# Patient Record
Sex: Male | Born: 1945
Health system: Southern US, Community
[De-identification: ages and names within clinical notes are randomized; demographics above are authoritative.]

## PROBLEM LIST (undated history)

## (undated) DIAGNOSIS — K7460 Unspecified cirrhosis of liver: Secondary | ICD-10-CM

## (undated) DIAGNOSIS — G5600 Carpal tunnel syndrome, unspecified upper limb: Secondary | ICD-10-CM

## (undated) DIAGNOSIS — K649 Unspecified hemorrhoids: Secondary | ICD-10-CM

## (undated) DIAGNOSIS — E785 Hyperlipidemia, unspecified: Secondary | ICD-10-CM

## (undated) DIAGNOSIS — I712 Thoracic aortic aneurysm, without rupture, unspecified: Secondary | ICD-10-CM

## (undated) HISTORY — PX: TONSILLECTOMY: SUR1361

## (undated) HISTORY — DX: Unspecified hemorrhoids: K64.9

## (undated) HISTORY — DX: Thoracic aortic aneurysm, without rupture, unspecified: I71.20

## (undated) HISTORY — DX: Unspecified cirrhosis of liver: K74.60

## (undated) HISTORY — DX: Carpal tunnel syndrome, unspecified upper limb: G56.00

## (undated) HISTORY — DX: Hyperlipidemia, unspecified: E78.5

## (undated) HISTORY — PX: EYE SURGERY: SHX253

---
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
## 2009-11-30 HISTORY — PX: COLONOSCOPY: SHX174

## 2009-12-13 LAB — HM COLONOSCOPY: HM Colonoscopy: NORMAL

## 2013-12-13 ENCOUNTER — Ambulatory Visit (INDEPENDENT_AMBULATORY_CARE_PROVIDER_SITE_OTHER): Payer: 59 | Admitting: Internal Medicine

## 2013-12-13 ENCOUNTER — Encounter: Payer: Self-pay | Admitting: Internal Medicine

## 2013-12-13 VITALS — BP 138/88 | HR 63 | Temp 98.0°F | Resp 16 | Ht 70.1 in | Wt 178.8 lb

## 2013-12-13 DIAGNOSIS — Z23 Encounter for immunization: Secondary | ICD-10-CM

## 2013-12-13 DIAGNOSIS — R5383 Other fatigue: Principal | ICD-10-CM

## 2013-12-13 DIAGNOSIS — R030 Elevated blood-pressure reading, without diagnosis of hypertension: Secondary | ICD-10-CM

## 2013-12-13 DIAGNOSIS — R5381 Other malaise: Secondary | ICD-10-CM

## 2013-12-13 DIAGNOSIS — E785 Hyperlipidemia, unspecified: Secondary | ICD-10-CM

## 2013-12-13 DIAGNOSIS — IMO0001 Reserved for inherently not codable concepts without codable children: Secondary | ICD-10-CM

## 2013-12-13 MED ORDER — ATORVASTATIN CALCIUM 20 MG PO TABS: 20.0000 mg | ORAL_TABLET | Freq: Every day | ORAL | Status: DC

## 2013-12-13 MED ORDER — TETANUS-DIPHTH-ACELL PERTUSSIS 5-2.5-18.5 LF-MCG/0.5 IM SUSP: 0.5000 mL | Freq: Once | INTRAMUSCULAR | Status: DC

## 2013-12-13 MED ORDER — ZOSTER VACCINE LIVE 19400 UNT/0.65ML ~~LOC~~ SOLR: 0.6500 mL | Freq: Once | SUBCUTANEOUS | Status: DC

## 2013-12-13 NOTE — Progress Notes (Signed)
Patient ID: Richard Clayton, male   DOB: Mar 15, 1946, 68 y.o.   MRN: [id number]     Patient Active Problem List   Diagnosis Date Noted  . Elevated blood pressure 12/16/2013  . Hyperlipidemia LDL goal < 160 12/16/2013    Subjective:  CC:   Chief Complaint  Patient presents with  . Establish Care    HPI:   Richard Clayton is a 68 y.o. male who presents as a new patient to establish primary care with the chief complaint of  Have bloodwork. 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  Relocated from [place] 3 months ago. No chief complaint.  Here to establish care. Takes a statin and needs labs done today  Works out regularly, using the treadmill and weights 3 times weekly.  No muscle pain or joint pain .  No dyspne or chest pain with exercise.  Healthy Mediterranean diet, Planning on taking up swimming. Has occasional knee pain which improved when he stopped jogging.  NO history of herniated disk, but has had back pain in the past and attributes resolution to maintaining regular exercise.  Confirmed bachelor, Has a cat for companion .  Indoor.,   Last Colonoscopy 2011.     Past Medical History  Diagnosis Date  . Hyperlipidemia     Past Surgical History  Procedure Laterality Date  . Tonsilectomy/adenoidectomy with myringotomy  1950    Family History  Problem Relation Age of Onset  . Hyperlipidemia Mother   . Cancer Father 27    from working in [employer]  . Cancer Sister 76    in remission    History   Social History  . Marital Status: Single    Spouse Name: N/A    Number of Children: N/A  . Years of Education: N/A   Occupational History  . Not on file.   Social History Main Topics  . Smoking status: Former Smoker -- 1.00 packs/day    Types: Cigarettes    Quit date: 12/13/1968  . Smokeless tobacco: Never Used  . Alcohol Use: Yes     Comment: wine in the evenings   . Drug Use: No  . Sexual Activity: Yes   Other Topics Concern  . Not on file   Social History Narrative  . No narrative on file   No Known Allergies   Review of Systems:  Patient denies headache, fevers, malaise, unintentional weight loss, skin rash, eye pain, sinus congestion and sinus pain, sore throat, dysphagia,  hemoptysis , cough, dyspnea, wheezing, chest pain, palpitations, orthopnea, edema, abdominal pain, nausea, melena, diarrhea, constipation, flank pain, dysuria, hematuria, urinary  Frequency, nocturia, numbness, tingling, seizures,  Focal weakness, Loss of consciousness,  Tremor, insomnia, depression, anxiety, and suicidal ideation.          Objective:  BP 138/88  Pulse 63  Temp(Src) 98 F (36.7 C) (Oral)  Resp 16  Ht 5' 10.1" (1.781 m)  Wt 178 lb 12 oz (81.08 kg)  BMI 25.56 kg/m2  SpO2 97%  General appearance: alert, cooperative and appears stated age Ears: normal TM's and external ear canals both ears Throat: lips, mucosa, and tongue normal; teeth and gums normal Neck: no adenopathy, no carotid bruit, supple, symmetrical, trachea midline and thyroid not enlarged, symmetric, no tenderness/mass/nodules Back: symmetric, no curvature. ROM normal. No CVA tenderness. Lungs: clear to auscultation bilaterally Heart: regular rate and rhythm, S1, S2 normal, no murmur, click,  rub or gallop Abdomen: soft, non-tender; bowel sounds normal; no masses,  no organomegaly Pulses: 2+ and symmetric Skin: Skin color, texture, turgor normal. No rashes or lesions Lymph nodes: Cervical, supraclavicular, and axillary nodes normal.  Assessment and Plan:  Elevated blood pressure Diastolic elevation discussed.  No prior history of hypertension.  He has normal renal function and no proteinuria.  Will follow for now Return in 6 months.   Lab Results  Component Value Date   CREATININE 1.0 12/13/2013    Lab Results  Component Value Date   MICROALBUR 0.2 12/13/2013     Hyperlipidemia LDL goal < 160 Managed with statin . Well controlled on current statin therapy.   Liver enzymes are normal ,except for mild elevation of bilirubin.  no changes today.return in 6 months for repeat surveillance.   Lab Results  Component Value Date   CHOL 158 12/13/2013   HDL 71.10 12/13/2013   LDLCALC 77 12/13/2013   TRIG 52.0 12/13/2013   CHOLHDL 2 12/13/2013   Lab Results  Component Value Date   ALT 30 12/13/2013   AST 34 12/13/2013   ALKPHOS 79 12/13/2013   BILITOT 1.4* 12/13/2013     A total of 45 minutes was spent with patient more than half of which was spent in counseling, reviewing records from other prior providers and coordination of care.  Updated Medication List Outpatient Encounter Prescriptions as of 12/13/2013  Medication Sig  . Ascorbic Acid (VITAMIN C) 1000 MG tablet Take 1,000 mg by mouth daily.  [name]. aspirin 81 MG tablet Take 81 mg by mouth daily.  [name]. atorvastatin (LIPITOR) 20 MG tablet Take 1 tablet (20 mg total) by mouth daily.  . Lecithin 1200 MG CAPS Take 1 capsule by mouth 3 (three) times daily.  . Misc Natural Products (OSTEO BI-FLEX ADV JOINT SHIELD PO) Take 1 tablet by mouth daily.  . Multiple Vitamins tablet Take 1 tablet by mouth daily.  . Tdap (BOOSTRIX) 5-2.5-18.5 LF-MCG/0.5 injection Inject 0.5 mLs into the muscle once.  . vitamin E (VITAMIN E) 1000 UNIT capsule Take 1,000 Units by mouth daily.  [name]. zoster vaccine live, PF, (ZOSTAVAX) [id number] UNT/0.65ML injection Inject 19,400 Units into the skin once.  . [DISCONTINUED] atorvastatin (LIPITOR) 20 MG tablet Take 20 mg by mouth daily.     Orders Placed This Encounter  Procedures  . Pneumococcal conjugate vaccine 13-valent  . CBC with Differential  . Comprehensive metabolic panel  . TSH  . Lipid panel  . Microalbumin / creatinine urine ratio  . HM COLONOSCOPY    No Follow-up on file.

## 2013-12-13 NOTE — Patient Instructions (Addendum)
Welcome to the [facility]!!  I enjoyed meeting you today.  Please thank Roe Coombs for referring you to me for your healthcare.   You received the Prevnar (pneumonia) vaccine.  We will call you with the results of your labs today.  Plan to return in 6 months for your annual wellness exam   I recommend getting the  tetanus-diptheria-pertussis vaccine (TDaP) and the Zostavax (Shingles vaccine) but you may spend less $$$ at a local pharmacy with the scripts I have provided you.      Since you are [place], you may want to try :     [facility]  [address] (?) in [place]  For your deli needs(fresh mozzarella, sausage,  Etc.)   [facility] in [place], [place] (30 minutes, towards [place])  for excellent [place] fare/dining.   Debarah Crape the owner , is from [facility] in [place] is also a very nice and Musician in the [employer]

## 2013-12-13 NOTE — Progress Notes (Signed)
Pre-visit discussion using our clinic review tool. No additional management support is needed unless otherwise documented below in the visit note.  

## 2013-12-14 LAB — COMPREHENSIVE METABOLIC PANEL
ALT: 30 U/L (ref 0–53)
AST: 34 U/L (ref 0–37)
Albumin: 4.2 g/dL (ref 3.5–5.2)
Alkaline Phosphatase: 79 U/L (ref 39–117)
BILIRUBIN TOTAL: 1.4 mg/dL — AB (ref 0.3–1.2)
BUN: 14 mg/dL (ref 6–23)
CO2: 28 meq/L (ref 19–32)
Calcium: 9.6 mg/dL (ref 8.4–10.5)
Chloride: 103 mEq/L (ref 96–112)
Creatinine, Ser: 1 mg/dL (ref 0.4–1.5)
GFR: 77.96 mL/min (ref >60.00)
GLUCOSE: 81 mg/dL (ref 70–99)
POTASSIUM: 4.1 meq/L (ref 3.5–5.1)
Sodium: 138 mEq/L (ref 135–145)
TOTAL PROTEIN: 7.4 g/dL (ref 6.0–8.3)

## 2013-12-14 LAB — TSH: TSH: 1.27 u[IU]/mL (ref 0.35–5.50)

## 2013-12-14 LAB — LIPID PANEL
CHOLESTEROL: 158 mg/dL (ref 0–200)
HDL: 71.1 mg/dL (ref >39.00)
LDL CALC: 77 mg/dL (ref 0–99)
TRIGLYCERIDES: 52 mg/dL (ref 0.0–149.0)
Total CHOL/HDL Ratio: 2
VLDL: 10.4 mg/dL (ref 0.0–40.0)

## 2013-12-14 LAB — CBC WITH DIFFERENTIAL/PLATELET
Basophils Absolute: 0 10*3/uL (ref 0.0–0.1)
Basophils Relative: 0.5 % (ref 0.0–3.0)
EOS ABS: 0.2 10*3/uL (ref 0.0–0.7)
EOS PCT: 2.6 % (ref 0.0–5.0)
HCT: 43.2 % (ref 39.0–52.0)
HEMOGLOBIN: 14.7 g/dL (ref 13.0–17.0)
LYMPHS PCT: 17.4 % (ref 12.0–46.0)
Lymphs Abs: 1.2 10*3/uL (ref 0.7–4.0)
MCHC: 34 g/dL (ref 30.0–36.0)
MCV: 89.2 fl (ref 78.0–100.0)
Monocytes Absolute: 0.6 10*3/uL (ref 0.1–1.0)
Monocytes Relative: 9.1 % (ref 3.0–12.0)
NEUTROS ABS: 5 10*3/uL (ref 1.4–7.7)
Neutrophils Relative %: 70.4 % (ref 43.0–77.0)
Platelets: 213 10*3/uL (ref 150.0–400.0)
RBC: 4.84 Mil/uL (ref 4.22–5.81)
RDW: 12.6 % (ref 11.5–14.6)
WBC: 7.1 10*3/uL (ref 4.5–10.5)

## 2013-12-14 LAB — MICROALBUMIN / CREATININE URINE RATIO
CREATININE, U: 35.6 mg/dL
MICROALB/CREAT RATIO: 0.6 mg/g (ref 0.0–30.0)
Microalb, Ur: 0.2 mg/dL (ref 0.0–1.9)

## 2013-12-16 ENCOUNTER — Encounter: Payer: Self-pay | Admitting: Internal Medicine

## 2013-12-16 DIAGNOSIS — IMO0001 Reserved for inherently not codable concepts without codable children: Secondary | ICD-10-CM | POA: Insufficient documentation

## 2013-12-16 DIAGNOSIS — E785 Hyperlipidemia, unspecified: Secondary | ICD-10-CM | POA: Insufficient documentation

## 2013-12-16 DIAGNOSIS — R030 Elevated blood-pressure reading, without diagnosis of hypertension: Secondary | ICD-10-CM

## 2013-12-16 NOTE — Assessment & Plan Note (Signed)
Managed with statin . Well controlled on current statin therapy.   Liver enzymes are normal ,except for mild elevation of bilirubin.  no changes today.return in 6 months for repeat surveillance.   Lab Results  Component Value Date   CHOL 158 12/13/2013   HDL 71.10 12/13/2013   LDLCALC 77 12/13/2013   TRIG 52.0 12/13/2013   CHOLHDL 2 12/13/2013   Lab Results  Component Value Date   ALT 30 12/13/2013   AST 34 12/13/2013   ALKPHOS 79 12/13/2013   BILITOT 1.4* 12/13/2013

## 2013-12-16 NOTE — Assessment & Plan Note (Addendum)
Diastolic elevation discussed.  No prior history of hypertension.  He has normal renal function and no proteinuria.  Will follow for now Return in 6 months.   Lab Results  Component Value Date   CREATININE 1.0 12/13/2013    Lab Results  Component Value Date   MICROALBUR 0.2 12/13/2013

## 2013-12-19 ENCOUNTER — Encounter: Payer: Self-pay | Admitting: *Deleted

## 2014-03-12 ENCOUNTER — Other Ambulatory Visit: Payer: Self-pay | Admitting: Internal Medicine

## 2014-06-08 ENCOUNTER — Other Ambulatory Visit: Payer: Self-pay | Admitting: Internal Medicine

## 2014-07-13 ENCOUNTER — Ambulatory Visit (INDEPENDENT_AMBULATORY_CARE_PROVIDER_SITE_OTHER): Payer: 59 | Admitting: Internal Medicine

## 2014-07-13 ENCOUNTER — Encounter: Payer: Self-pay | Admitting: Internal Medicine

## 2014-07-13 VITALS — BP 124/60 | HR 79 | Temp 98.3°F | Resp 16 | Ht 70.0 in | Wt 170.8 lb

## 2014-07-13 DIAGNOSIS — R030 Elevated blood-pressure reading, without diagnosis of hypertension: Secondary | ICD-10-CM

## 2014-07-13 DIAGNOSIS — R634 Abnormal weight loss: Secondary | ICD-10-CM

## 2014-07-13 DIAGNOSIS — E785 Hyperlipidemia, unspecified: Secondary | ICD-10-CM

## 2014-07-13 DIAGNOSIS — Z0000 Encounter for general adult medical examination without abnormal findings: Secondary | ICD-10-CM

## 2014-07-13 DIAGNOSIS — Z1159 Encounter for screening for other viral diseases: Secondary | ICD-10-CM

## 2014-07-13 DIAGNOSIS — Z125 Encounter for screening for malignant neoplasm of prostate: Secondary | ICD-10-CM

## 2014-07-13 DIAGNOSIS — IMO0001 Reserved for inherently not codable concepts without codable children: Secondary | ICD-10-CM

## 2014-07-13 DIAGNOSIS — R5381 Other malaise: Secondary | ICD-10-CM

## 2014-07-13 DIAGNOSIS — R5383 Other fatigue: Secondary | ICD-10-CM

## 2014-07-13 DIAGNOSIS — Z79899 Other long term (current) drug therapy: Secondary | ICD-10-CM

## 2014-07-13 LAB — COMPREHENSIVE METABOLIC PANEL
ALBUMIN: 4.1 g/dL (ref 3.5–5.2)
ALT: 29 U/L (ref 0–53)
AST: 30 U/L (ref 0–37)
Alkaline Phosphatase: 82 U/L (ref 39–117)
BUN: 11 mg/dL (ref 6–23)
CO2: 27 meq/L (ref 19–32)
Calcium: 10 mg/dL (ref 8.4–10.5)
Chloride: 99 mEq/L (ref 96–112)
Creatinine, Ser: 0.9 mg/dL (ref 0.4–1.5)
GFR: 86.68 mL/min (ref >60.00)
Glucose, Bld: 90 mg/dL (ref 70–99)
POTASSIUM: 4.4 meq/L (ref 3.5–5.1)
SODIUM: 135 meq/L (ref 135–145)
TOTAL PROTEIN: 6.8 g/dL (ref 6.0–8.3)
Total Bilirubin: 1.4 mg/dL — ABNORMAL HIGH (ref 0.2–1.2)

## 2014-07-13 LAB — CBC WITH DIFFERENTIAL/PLATELET
BASOS ABS: 0 10*3/uL (ref 0.0–0.1)
Basophils Relative: 0.5 % (ref 0.0–3.0)
Eosinophils Absolute: 0.1 10*3/uL (ref 0.0–0.7)
Eosinophils Relative: 1.8 % (ref 0.0–5.0)
HCT: 43.8 % (ref 39.0–52.0)
HEMOGLOBIN: 14.9 g/dL (ref 13.0–17.0)
Lymphocytes Relative: 20.4 % (ref 12.0–46.0)
Lymphs Abs: 1 10*3/uL (ref 0.7–4.0)
MCHC: 34 g/dL (ref 30.0–36.0)
MCV: 90.5 fl (ref 78.0–100.0)
MONO ABS: 0.5 10*3/uL (ref 0.1–1.0)
MONOS PCT: 10.1 % (ref 3.0–12.0)
NEUTROS ABS: 3.3 10*3/uL (ref 1.4–7.7)
Neutrophils Relative %: 67.2 % (ref 43.0–77.0)
PLATELETS: 164 10*3/uL (ref 150.0–400.0)
RBC: 4.84 Mil/uL (ref 4.22–5.81)
RDW: 12.9 % (ref 11.5–15.5)
WBC: 4.9 10*3/uL (ref 4.0–10.5)

## 2014-07-13 LAB — LIPID PANEL
CHOL/HDL RATIO: 2
Cholesterol: 156 mg/dL (ref 0–200)
HDL: 68.3 mg/dL (ref >39.00)
LDL Cholesterol: 78 mg/dL (ref 0–99)
NonHDL: 87.7
Triglycerides: 48 mg/dL (ref 0.0–149.0)
VLDL: 9.6 mg/dL (ref 0.0–40.0)

## 2014-07-13 LAB — TSH: TSH: 1.4 u[IU]/mL (ref 0.35–4.50)

## 2014-07-13 LAB — PSA, MEDICARE: PSA: 1.59 ng/ml (ref 0.10–4.00)

## 2014-07-13 MED ORDER — ZOSTER VACCINE LIVE 19400 UNT/0.65ML ~~LOC~~ SOLR: 0.6500 mL | Freq: Once | SUBCUTANEOUS | Status: DC

## 2014-07-13 MED ORDER — TETANUS-DIPHTH-ACELL PERTUSSIS 5-2.5-18.5 LF-MCG/0.5 IM SUSP: 0.5000 mL | Freq: Once | INTRAMUSCULAR | Status: DC

## 2014-07-13 NOTE — Patient Instructions (Signed)
You had your annual Medicare wellness exam today  Please use the stool kit to send [place] back a sample to test for blood.  This is your colon CA screening test.   You need to have a TDaP vaccine and a Shingles vaccine.  I have given you prescriptions for thses because they will be cheaper at the health Dept or at your  local pharmacy because Medicare will not reimburse for them.   We will contact you with the bloodwork results  Health Maintenance A healthy lifestyle and preventative care can promote health and wellness.  Maintain regular health, dental, and eye exams.  Eat a healthy diet. Foods like vegetables, fruits, whole grains, low-fat dairy products, and lean protein foods contain the nutrients you need and are low in calories. Decrease your intake of foods high in solid fats, added sugars, and salt. Get information about a proper diet from your health care provider, if necessary.  Regular physical exercise is one of the most important things you can do for your health. Most adults should get at least 150 minutes of moderate-intensity exercise (any activity that increases your heart rate and causes you to sweat) each week. In addition, most adults need muscle-strengthening exercises on 2 or more days a week.   Maintain a healthy weight. The body mass index (BMI) is a screening tool to identify possible weight problems. It provides an estimate of body fat based on height and weight. Your health care provider can find your BMI and can help you achieve or maintain a healthy weight. For males 20 years and older:  A BMI below 18.5 is considered underweight.  A BMI of 18.5 to 24.9 is normal.  A BMI of 25 to 29.9 is considered overweight.  A BMI of 30 and above is considered obese.  Maintain normal blood lipids and cholesterol by exercising and minimizing your intake of saturated fat. Eat a balanced diet with plenty of fruits and vegetables. Blood tests for lipids and cholesterol should  begin at age 38 and be repeated every 5 years. If your lipid or cholesterol levels are high, you are over age 31, or you are at high risk for heart disease, you may need your cholesterol levels checked more frequently.Ongoing high lipid and cholesterol levels should be treated with medicines if diet and exercise are not working.  If you smoke, find out from your health care provider how to quit. If you do not use tobacco, do not start.  Lung cancer screening is recommended for adults aged 33-80 years who are at high risk for developing lung cancer because of a history of smoking. A yearly low-dose CT scan of the lungs is recommended for people who have at least a 30-pack-year history of smoking and are current smokers or have quit within the past 15 years. A pack year of smoking is smoking an average of 1 pack of cigarettes a day for 1 year (for example, a 30-pack-year history of smoking could mean smoking 1 pack a day for 30 years or 2 packs a day for 15 years). Yearly screening should continue until the smoker has stopped smoking for at least 15 years. Yearly screening should be stopped for people who develop a health problem that would prevent them from having lung cancer treatment.  If you choose to drink alcohol, do not have more than 2 drinks per day. One drink is considered to be 12 oz (360 mL) of beer, 5 oz (150 mL) of wine, or 1.5  oz (45 mL) of liquor.  Avoid the use of street drugs. Do not share needles with anyone. Ask for help if you need support or instructions about stopping the use of drugs.  High blood pressure causes heart disease and increases the risk of stroke. Blood pressure should be checked at least every 1-2 years. Ongoing high blood pressure should be treated with medicines if weight loss and exercise are not effective.  If you are 25-52 years old, ask your health care provider if you should take aspirin to prevent heart disease.  Diabetes screening involves taking a blood  sample to check your fasting blood sugar level. This should be done once every 3 years after age 90 if you are at a normal weight and without risk factors for diabetes. Testing should be considered at a younger age or be carried out more frequently if you are overweight and have at least 1 risk factor for diabetes.  Colorectal cancer can be detected and often prevented. Most routine colorectal cancer screening begins at the age of 35 and continues through age 3. However, your health care provider may recommend screening at an earlier age if you have risk factors for colon cancer. On a yearly basis, your health care provider may provide home test kits to check for hidden blood in the stool. A small camera at the end of a tube may be used to directly examine the colon (sigmoidoscopy or colonoscopy) to detect the earliest forms of colorectal cancer. Talk to your health care provider about this at age 63 when routine screening begins. A direct exam of the colon should be repeated every 5-10 years through age 48, unless early forms of precancerous polyps or small growths are found.  People who are at an increased risk for hepatitis B should be screened for this virus. You are considered at high risk for hepatitis B if:  You were born in a country where hepatitis B occurs often. Talk with your health care provider about which countries are considered high risk.  Your parents were born in a high-risk country and you have not received a shot to protect against hepatitis B (hepatitis B vaccine).  You have HIV or AIDS.  You use needles to inject street drugs.  You live with, or have sex with, someone who has hepatitis B.  You are a man who has sex with other men (MSM).  You get hemodialysis treatment.  You take certain medicines for conditions like cancer, organ transplantation, and autoimmune conditions.  Hepatitis C blood testing is recommended for all people born from [birth year] through 1966 and any  individual with known risk factors for hepatitis C.  Healthy men should no longer receive prostate-specific antigen (PSA) blood tests as part of routine cancer screening. Talk to your health care provider about prostate cancer screening.  Testicular cancer screening is not recommended for adolescents or adult males who have no symptoms. Screening includes self-exam, a health care provider exam, and other screening tests. Consult with your health care provider about any symptoms you have or any concerns you have about testicular cancer.  Practice safe sex. Use condoms and avoid high-risk sexual practices to reduce the spread of sexually transmitted infections (STIs).  You should be screened for STIs, including gonorrhea and chlamydia if:  You are sexually active and are younger than 24 years.  You are older than 24 years, and your health care provider tells you that you are at risk for this type of infection.  Your  sexual activity has changed since you were last screened, and you are at an increased risk for chlamydia or gonorrhea. Ask your health care provider if you are at risk.  If you are at risk of being infected with HIV, it is recommended that you take a prescription medicine daily to prevent HIV infection. This is called pre-exposure prophylaxis (PrEP). You are considered at risk if:  You are a man who has sex with other men (MSM).  You are a heterosexual man who is sexually active with multiple partners.  You take drugs by injection.  You are sexually active with a partner who has HIV.  Talk with your health care provider about whether you are at high risk of being infected with HIV. If you choose to begin PrEP, you should first be tested for HIV. You should then be tested every 3 months for as long as you are taking PrEP.  Use sunscreen. Apply sunscreen liberally and repeatedly throughout the day. You should seek shade when your shadow is shorter than you. Protect yourself by  wearing long sleeves, pants, a wide-brimmed hat, and sunglasses year round whenever you are outdoors.  Tell your health care provider of new moles or changes in moles, especially if there is a change in shape or color. Also, tell your health care provider if a mole is larger than the size of a pencil eraser.  A one-time screening for abdominal aortic aneurysm (AAA) and surgical repair of large AAAs by ultrasound is recommended for men aged 60-75 years who are current or former smokers.  Stay current with your vaccines (immunizations). Document Released: 05/14/2008 Document Revised: 11/21/2013 Document Reviewed: 04/13/2011 [facility] Patient Information 2015 [place], [place]. This information is not intended to replace advice given to you by your health care provider. Make sure you discuss any questions you have with your health care provider.

## 2014-07-13 NOTE — Progress Notes (Signed)
Pre visit review using our clinic review tool, if applicable. No additional management support is needed unless otherwise documented below in the visit note. 

## 2014-07-13 NOTE — Progress Notes (Signed)
Patient ID: Richard ComesSalvatore Clayton, male   DOB: May 20, 1946, 68 y.o.   MRN: [id number]   The patient is here for annual Medicare wellness examination along with a comprehensive physical exam and management of other chronic and acute problems.  Has lost 8 lbs and reduced his blood pressure s by reducing the salt in his diet .  He continues to exercise regularly and denies andy s significant joint pain, but notes stiffness of knees and lower back early morning upon rising whic improves with activity     The risk factors are reflected in the social history.  The roster of all physicians providing medical care to patient - is listed in the Snapshot section of the chart.  Activities of daily living:  The patient is 100% independent in all ADLs: dressing, toileting, feeding as well as independent mobility  Home safety : The patient has smoke detectors in the home. They wear seatbelts.  There are no firearms at home. There is no violence in the home.   There is no risks for hepatitis, STDs or HIV. There is no   history of blood transfusion. They have no travel history to infectious disease endemic areas of the world.  The patient has seen their dentist in the last six month. They have seen their eye doctor in the last year. They admit to slight hearing difficulty with regard to whispered voices and some television programs.  They have deferred audiologic testing in the last year.  They do not  have excessive sun exposure. Discussed the need for sun protection: hats, long sleeves and use of sunscreen if there is significant sun exposure.   Diet: the importance of a healthy diet is discussed. They do have a healthy diet.  The benefits of regular aerobic exercise were discussed. She walks 4 times per week ,  20 minutes.   Depression screen: there are no signs or vegative symptoms of depression- irritability, change in appetite, anhedonia, sadness/tearfullness.  Cognitive assessment: the patient manages all  their financial and personal affairs and is actively engaged. They could relate day,date,year and events; recalled 2/3 objects at 3 minutes; performed clock-face test normally.  The following portions of the patient's history were reviewed and updated as appropriate: allergies, current medications, past family history, past medical history,  past surgical history, past social history  and problem list.  Visual acuity was not assessed per patient preference since she has regular follow up with her ophthalmologist. Hearing and body mass index were assessed and reviewed.   During the course of the visit the patient was educated and counseled about appropriate screening and preventive services including : fall prevention , diabetes screening, nutrition counseling, colorectal cancer screening, and recommended immunizations.    Objective:  BP 124/60  Pulse 79  Temp(Src) 98.3 F (36.8 C) (Oral)  Resp 16  Ht 5\' 10"  (1.778 m)  Wt 170 lb 12.8 oz (77.474 kg)  BMI 24.51 kg/m2  SpO2 99%  General Appearance:    Alert, cooperative, no distress, appears stated age  Head:    Normocephalic, without obvious abnormality, atraumatic  Eyes:    PERRL, conjunctiva/corneas clear, EOM's intact, fundi    benign, both eyes       Ears:    Normal TM's and external ear canals, both ears  Nose:   Nares normal, septum midline, mucosa normal, no drainage   or sinus tenderness  Throat:   Lips, mucosa, and tongue normal; teeth and gums normal  Neck:   Supple, symmetrical,  trachea midline, no adenopathy;       thyroid:  No enlargement/tenderness/nodules; no carotid   bruit or JVD  Back:     Symmetric, no curvature, ROM normal, no CVA tenderness  Lungs:     Clear to auscultation bilaterally, respirations unlabored  Chest wall:    No tenderness or deformity  Heart:    Regular rate and rhythm, S1 and S2 normal, no murmur, rub   or gallop  Abdomen:     Soft, non-tender, bowel sounds active all four quadrants,    no  masses, no organomegaly  Genitalia:    Normal circumsized male without lesion, discharge or tenderness. No inguinal hernias or testicular masses  Rectal:    Normal tone, normal prostate, no masses or tenderness;     Extremities:   Extremities normal, atraumatic, no cyanosis or edema  Pulses:   2+ and symmetric all extremities  Skin:   Skin color, texture, turgor normal, no rashes or lesions  Lymph nodes:   Cervical, supraclavicular, and axillary nodes normal  Neurologic:   CNII-XII intact. Normal strength, sensation and reflexes      throughout   Assessment and Plan:  Elevated blood pressure Resolved with reduction of salt in diet and wt loss.   Hyperlipidemia with target LDL less than 160 Managed with lipitor. HDL/LDL ratio is very favorable.  Liver enzymes are normal , no changes today.  Lab Results  Component Value Date   CHOL 156 07/13/2014   HDL 68.30 07/13/2014   LDLCALC 78 07/13/2014   TRIG 48.0 07/13/2014   CHOLHDL 2 07/13/2014    Lab Results  Component Value Date   ALT 29 07/13/2014   AST 30 07/13/2014   ALKPHOS 82 07/13/2014   BILITOT 1.4* 07/13/2014     Medicare annual wellness visit, subsequent Annual male wellness visit was done along with  Screening for STDS and depressive symptoms was negative. Vaccines were reviewed and .  Healthy habits were reviewed including use of seatbelts 100% of the time , moderate alcohol consumption, regular exercise, and the  mediterranean diet.   Physical exam, annual  a comprehensive physical exam including testicular and prostate exam and check for hernias was done.    Updated Medication List Outpatient Encounter Prescriptions as of 07/13/2014  Medication Sig  . Ascorbic Acid (VITAMIN C) 1000 MG tablet Take 1,000 mg by mouth daily.  [name] atorvastatin (LIPITOR) 20 MG tablet TAKE 1 TABLET BY MOUTH EVERY DAY  . Lecithin 1200 MG CAPS Take 1 capsule by mouth 3 (three) times daily.  . Misc Natural Products (OSTEO BI-FLEX ADV JOINT SHIELD  PO) Take 1 tablet by mouth daily.  . Multiple Vitamins tablet Take 1 tablet by mouth daily.  . vitamin E (VITAMIN E) 1000 UNIT capsule Take 1,000 Units by mouth daily.  [name] aspirin 81 MG tablet Take 81 mg by mouth daily.  . Tdap (BOOSTRIX) 5-2.5-18.5 LF-MCG/0.5 injection Inject 0.5 mLs into the muscle once.  . Tdap (BOOSTRIX) 5-2.5-18.5 LF-MCG/0.5 injection Inject 0.5 mLs into the muscle once.  . zoster vaccine live, PF, (ZOSTAVAX) 78295 UNT/0.65ML injection Inject 19,400 Units into the skin once.  . zoster vaccine live, PF, (ZOSTAVAX) 62130 UNT/0.65ML injection Inject 19,400 Units into the skin once.

## 2014-07-14 LAB — HEPATITIS C ANTIBODY: HCV Ab: NEGATIVE

## 2014-07-15 ENCOUNTER — Encounter: Payer: Self-pay | Admitting: Internal Medicine

## 2014-07-15 DIAGNOSIS — Z0000 Encounter for general adult medical examination without abnormal findings: Secondary | ICD-10-CM | POA: Insufficient documentation

## 2014-07-15 NOTE — Assessment & Plan Note (Signed)
Resolved with reduction of salt in diet and wt loss.

## 2014-07-15 NOTE — Assessment & Plan Note (Signed)
a comprehensive physical exam including testicular and prostate exam and check for hernias was done.

## 2014-07-15 NOTE — Assessment & Plan Note (Signed)
Managed with lipitor. HDL/LDL ratio is very favorable.  Liver enzymes are normal , no changes today.  Lab Results  Component Value Date   CHOL 156 07/13/2014   HDL 68.30 07/13/2014   LDLCALC 78 07/13/2014   TRIG 48.0 07/13/2014   CHOLHDL 2 07/13/2014    Lab Results  Component Value Date   ALT 29 07/13/2014   AST 30 07/13/2014   ALKPHOS 82 07/13/2014   BILITOT 1.4* 07/13/2014

## 2014-07-15 NOTE — Assessment & Plan Note (Addendum)
Annual male wellness visit was done along with  Screening for STDS and depressive symptoms was negative. Vaccines were reviewed and .  Healthy habits were reviewed including use of seatbelts 100% of the time , moderate alcohol consumption, regular exercise, and the  mediterranean diet.

## 2014-07-17 LAB — HEPATITIS C RNA QUANTITATIVE

## 2014-07-18 ENCOUNTER — Encounter: Payer: Self-pay | Admitting: *Deleted

## 2014-09-14 ENCOUNTER — Other Ambulatory Visit: Payer: Self-pay | Admitting: Internal Medicine

## 2014-12-10 ENCOUNTER — Other Ambulatory Visit: Payer: Self-pay | Admitting: Internal Medicine

## 2015-02-27 ENCOUNTER — Ambulatory Visit (INDEPENDENT_AMBULATORY_CARE_PROVIDER_SITE_OTHER): Payer: Medicare Other | Admitting: Internal Medicine

## 2015-02-27 ENCOUNTER — Encounter: Payer: Self-pay | Admitting: Internal Medicine

## 2015-02-27 VITALS — BP 118/72 | HR 66 | Temp 97.8°F | Resp 16 | Ht 70.0 in | Wt 171.2 lb

## 2015-02-27 DIAGNOSIS — E785 Hyperlipidemia, unspecified: Secondary | ICD-10-CM

## 2015-02-27 DIAGNOSIS — M791 Myalgia, unspecified site: Secondary | ICD-10-CM

## 2015-02-27 DIAGNOSIS — R42 Dizziness and giddiness: Secondary | ICD-10-CM

## 2015-02-27 DIAGNOSIS — E559 Vitamin D deficiency, unspecified: Secondary | ICD-10-CM | POA: Diagnosis not present

## 2015-02-27 DIAGNOSIS — R55 Syncope and collapse: Secondary | ICD-10-CM | POA: Diagnosis not present

## 2015-02-27 DIAGNOSIS — H269 Unspecified cataract: Secondary | ICD-10-CM

## 2015-02-27 LAB — LIPID PANEL
Cholesterol: 158 mg/dL (ref 0–200)
HDL: 69.3 mg/dL (ref >39.00)
LDL Cholesterol: 79 mg/dL (ref 0–99)
NonHDL: 88.7
TRIGLYCERIDES: 49 mg/dL (ref 0.0–149.0)
Total CHOL/HDL Ratio: 2
VLDL: 9.8 mg/dL (ref 0.0–40.0)

## 2015-02-27 LAB — VITAMIN D 25 HYDROXY (VIT D DEFICIENCY, FRACTURES): VITD: 25.5 ng/mL — AB (ref 30.00–100.00)

## 2015-02-27 LAB — CK: Total CK: 103 U/L (ref 7–232)

## 2015-02-27 LAB — COMPREHENSIVE METABOLIC PANEL
ALT: 22 U/L (ref 0–53)
AST: 21 U/L (ref 0–37)
Albumin: 4.2 g/dL (ref 3.5–5.2)
Alkaline Phosphatase: 94 U/L (ref 39–117)
BUN: 16 mg/dL (ref 6–23)
CALCIUM: 9.7 mg/dL (ref 8.4–10.5)
CHLORIDE: 102 meq/L (ref 96–112)
CO2: 32 mEq/L (ref 19–32)
Creatinine, Ser: 0.99 mg/dL (ref 0.40–1.50)
GFR: 79.5 mL/min (ref >60.00)
Glucose, Bld: 82 mg/dL (ref 70–99)
Potassium: 4.6 mEq/L (ref 3.5–5.1)
SODIUM: 137 meq/L (ref 135–145)
Total Bilirubin: 1 mg/dL (ref 0.2–1.2)
Total Protein: 6.9 g/dL (ref 6.0–8.3)

## 2015-02-27 LAB — IBC PANEL
Iron: 159 ug/dL (ref 42–165)
SATURATION RATIOS: 40.6 % (ref 20.0–50.0)
Transferrin: 280 mg/dL (ref 212.0–360.0)

## 2015-02-27 LAB — TSH: TSH: 1.22 u[IU]/mL (ref 0.35–4.50)

## 2015-02-27 MED ORDER — ATORVASTATIN CALCIUM 20 MG PO TABS: 20.0000 mg | ORAL_TABLET | Freq: Every day | ORAL | Status: DC

## 2015-02-27 NOTE — Progress Notes (Signed)
Patient ID: Richard Clayton, male   DOB: [birth year], 69 y.o.   MRN: [id number]  Patient Active Problem List   Diagnosis Date Noted  . Dizziness and giddiness 03/02/2015  . Bilateral cataracts 03/02/2015  . Vitamin D deficiency 03/02/2015  . Medicare annual wellness visit, subsequent 07/15/2014  . Physical exam, annual 07/15/2014  . Hyperlipidemia with target LDL less than 160 12/16/2013    Subjective:  CC:   Chief Complaint  Patient presents with  . Follow-up    6 month patient is fasting, patient would like to ask about giving red blood cells and having a syncope episode.    HPI:   Richard Clayton is a 69 y.o. male who presents for follow up on recent episode of recurrent dizziness.    Had a loss of balance which  Occurred with bending over ,  occurred  a week he modified his physical activity to recouperate after donating RBCs.  symptoms of dizziness without vertigo recurred every time he bent over for several weeks before finally resolving.   This occurred 4 or 5 months ago .   Strained his  back after bowling 2 months ago.  Pain occurred 3 days later .  States that while turning, felt a pop  Rested for 5 days,  Treated with motrin and a cold pack T12- L1 area.  Last episode of back pain was 5 yrs ago and very severe. No radiculopathy.  Needs ophthalmologist for cataracts extraction .  Would like to see dingledein   Past Medical History  Diagnosis Date  . Hyperlipidemia     Past Surgical History  Procedure Laterality Date  . Tonsilectomy/adenoidectomy with myringotomy  1950       The following portions of the patient's history were reviewed and updated as appropriate: Allergies, current medications, and problem list.    Review of Systems:   Patient denies headache, fevers, malaise, unintentional weight loss, skin rash, eye pain, sinus congestion and sinus pain, sore throat, dysphagia,  hemoptysis , cough, dyspnea, wheezing, chest pain, palpitations, orthopnea,  edema, abdominal pain, nausea, melena, diarrhea, constipation, flank pain, dysuria, hematuria, urinary  Frequency, nocturia, numbness, tingling, seizures,  Focal weakness, Loss of consciousness,  Tremor, insomnia, depression, anxiety, and suicidal ideation.     History   Social History  . Marital Status: Single    Spouse Name: N/A  . Number of Children: N/A  . Years of Education: N/A   Occupational History  . Not on file.   Social History Main Topics  . Smoking status: Former Smoker -- 1.00 packs/day    Types: Cigarettes    Quit date: 12/13/1968  . Smokeless tobacco: Never Used  . Alcohol Use: Yes     Comment: wine in the evenings  . Drug Use: No  . Sexual Activity: Yes   Other Topics Concern  . Not on file   Social History Narrative    Objective:  Filed Vitals:   02/27/15 1035  BP: 118/72  Pulse: 66  Temp: 97.8 F (36.6 C)  Resp: 16     General appearance: alert, cooperative and appears stated age Ears: normal TM's and external ear canals both ears Throat: lips, mucosa, and tongue normal; teeth and gums normal Neck: no adenopathy, no carotid bruit, supple, symmetrical, trachea midline and thyroid not enlarged, symmetric, no tenderness/mass/nodules Back: symmetric, no curvature. ROM normal. No CVA tenderness. Lungs: clear to auscultation bilaterally Heart: regular rate and rhythm, S1, S2 normal, no murmur, click, rub or gallop Abdomen: soft, non-tender;  bowel sounds normal; no masses,  no organomegaly Pulses: 2+ and symmetric Skin: Skin color, texture, turgor normal. No rashes or lesions Lymph nodes: Cervical, supraclavicular, and axillary nodes normal.  Assessment and Plan:  Dizziness and giddiness Patient attributes symptoms to previous donation of RBCS. Thyroid , lytes and CBC was normal today, and he was not orthostatic.  Advised to make appt if episide recurs, with our without prior RBC donation  Lab Results  Component Value Date   WBC 5.3 02/27/2015    HGB 15.2 02/27/2015   HCT 44.3 02/27/2015   MCV 90 02/27/2015   PLT 164.0 07/13/2014   Lab Results  Component Value Date   NA 137 02/27/2015   K 4.6 02/27/2015   CL 102 02/27/2015   CO2 32 02/27/2015   Lab Results  Component Value Date   TSH 1.22 02/27/2015      Bilateral cataracts Referral to Dr Dingledein discussed and recommended.    Vitamin D deficiency Mild, iwht no history of supplementation.  Advise to take 1000 IUs daily     Updated Medication List Outpatient Encounter Prescriptions as of 02/27/2015  Medication Sig  . Ascorbic Acid (VITAMIN C) 1000 MG tablet Take 1,000 mg by mouth daily.  [name] aspirin 81 MG tablet Take 81 mg by mouth daily.  [name] atorvastatin (LIPITOR) 20 MG tablet Take 1 tablet (20 mg total) by mouth daily.  . Lecithin 1200 MG CAPS Take 1 capsule by mouth 3 (three) times daily.  . Misc Natural Products (OSTEO BI-FLEX ADV JOINT SHIELD PO) Take 1 tablet by mouth daily.  . Multiple Vitamins tablet Take 1 tablet by mouth daily.  . Tdap (BOOSTRIX) 5-2.5-18.5 LF-MCG/0.5 injection Inject 0.5 mLs into the muscle once.  . Tdap (BOOSTRIX) 5-2.5-18.5 LF-MCG/0.5 injection Inject 0.5 mLs into the muscle once.  . vitamin E (VITAMIN E) 1000 UNIT capsule Take 1,000 Units by mouth daily.  [name] zoster vaccine live, PF, (ZOSTAVAX) 91478 UNT/0.65ML injection Inject 19,400 Units into the skin once.  . zoster vaccine live, PF, (ZOSTAVAX) 29562 UNT/0.65ML injection Inject 19,400 Units into the skin once.  . [DISCONTINUED] LIPITOR 20 MG tablet TAKE 1 TABLET BY MOUTH EVERY DAY  . [DISCONTINUED] LIPITOR 20 MG tablet TAKE 1 TABLET BY MOUTH EVERY DAY     Orders Placed This Encounter  Procedures  . Comprehensive metabolic panel  . Lipid panel  . Vit D  25 hydroxy (rtn osteoporosis monitoring)  . TSH  . CK  . CBC With Differential  . IBC panel    Return in about 6 months (around 08/30/2015).

## 2015-02-27 NOTE — Patient Instructions (Addendum)
I recommend dr. Dorcas Mcmurray for your cataract surgery .  Call if you need a referral   If you decide to donate any blood products again in the future,  Come by for an CBC the week afterward

## 2015-02-28 LAB — CBC WITH DIFFERENTIAL
Basophils Absolute: 0.1 10*3/uL (ref 0.0–0.2)
Basos: 1 %
EOS ABS: 0.1 10*3/uL (ref 0.0–0.4)
Eos: 2 %
HEMATOCRIT: 44.3 % (ref 37.5–51.0)
HEMOGLOBIN: 15.2 g/dL (ref 12.6–17.7)
IMMATURE GRANS (ABS): 0 10*3/uL (ref 0.0–0.1)
IMMATURE GRANULOCYTES: 0 %
Lymphocytes Absolute: 1.2 10*3/uL (ref 0.7–3.1)
Lymphs: 23 %
MCH: 30.8 pg (ref 26.6–33.0)
MCHC: 34.3 g/dL (ref 31.5–35.7)
MCV: 90 fL (ref 79–97)
Monocytes Absolute: 0.4 10*3/uL (ref 0.1–0.9)
Monocytes: 8 %
NEUTROS ABS: 3.5 10*3/uL (ref 1.4–7.0)
Neutrophils Relative %: 66 %
RBC: 4.93 x10E6/uL (ref 4.14–5.80)
RDW: 13.9 % (ref 12.3–15.4)
WBC: 5.3 10*3/uL (ref 3.4–10.8)

## 2015-03-02 ENCOUNTER — Encounter: Payer: Self-pay | Admitting: Internal Medicine

## 2015-03-02 DIAGNOSIS — H269 Unspecified cataract: Secondary | ICD-10-CM | POA: Insufficient documentation

## 2015-03-02 DIAGNOSIS — E559 Vitamin D deficiency, unspecified: Secondary | ICD-10-CM | POA: Insufficient documentation

## 2015-03-02 DIAGNOSIS — R42 Dizziness and giddiness: Secondary | ICD-10-CM | POA: Insufficient documentation

## 2015-03-02 NOTE — Assessment & Plan Note (Signed)
Mild, iwht no history of supplementation.  Advise to take 1000 IUs daily

## 2015-03-02 NOTE — Assessment & Plan Note (Addendum)
Patient attributes symptoms to previous donation of RBCS.s  CBC was normal today,  Advised to make appt if episide recurs, with our without prior RBC donation  Lab Results  Component Value Date   WBC 5.3 02/27/2015   HGB 15.2 02/27/2015   HCT 44.3 02/27/2015   MCV 90 02/27/2015   PLT 164.0 07/13/2014   Lab Results  Component Value Date   NA 137 02/27/2015   K 4.6 02/27/2015   CL 102 02/27/2015   CO2 32 02/27/2015   Lab Results  Component Value Date   TSH 1.22 02/27/2015

## 2015-03-02 NOTE — Assessment & Plan Note (Signed)
Referral to Dr Dingledein discussed and recommended.

## 2015-03-04 ENCOUNTER — Encounter: Payer: Self-pay | Admitting: *Deleted

## 2015-03-07 ENCOUNTER — Ambulatory Visit
Admit: 2015-03-07 | Disposition: A | Discharge status: Home / Self Care | Payer: Self-pay | Attending: Ophthalmology | Admitting: Ophthalmology

## 2015-03-11 ENCOUNTER — Ambulatory Visit
Admit: 2015-03-11 | Disposition: A | Discharge status: Home / Self Care | Payer: Self-pay | Attending: Ophthalmology | Admitting: Ophthalmology

## 2015-03-31 NOTE — Op Note (Signed)
PATIENT NAME:  Richard, Clayton MR#:  [id number] DATE OF BIRTH:  January 18, 1946  DATE OF PROCEDURE:  03/11/2015  PREOPERATIVE DIAGNOSIS:  Cataract, right eye.  POSTOPERATIVE DIAGNOSIS:  Cataract, right eye.  PROCEDURE PERFORMED:  Extracapsular cataract extraction using phacoemulsification with placement of an Alcon [id number], 16.5 -diopter posterior chamber lens, serial # [id number].  SURGEON:  Maylon Peppers. Neria Procter, MD  ASSISTANT:  None.  ANESTHESIA:  4% lidocaine and 0.75% Marcaine in a 50/50 mixture without Hylenex added, given as a peribulbar.  ANESTHESIOLOGIST:  Linward Natal, MD  COMPLICATIONS:  None.  ESTIMATED BLOOD LOSS:  Less than 1 ml.  DESCRIPTION OF PROCEDURE:  The patient was brought to the operating room and given a peribulbar block.  The patient was then prepped and draped in the usual fashion.  The vertical rectus muscles were imbricated using 5-0 silk sutures.  These sutures were then clamped to the sterile drapes as bridle sutures.  A limbal peritomy was performed extending two clock hours and hemostasis was obtained with cautery.  A partial thickness scleral groove was made at the surgical limbus and then dissected anteriorly in a lamellar dissection with using an Alcon crescent knife.  The anterior chamber was entered superonasally with a Superblade and through the lamellar dissection with a 2.6-mm keratome.  DisCoVisc was used to replace the aqueous and a continuous tear capsulorrhexis was carried out.  Hydrodissection and hydrodelineation were carried out with balanced salt and a 27 gauge canula.  The nucleus was rotated to confirm the effectiveness of the hydrodissection.  Phacoemulsification was carried out using a divide-and-conquer technique.  Total ultrasound time was 1 minute and 30 seconds with an average power of 24.3 percent.  Irrigation/aspiration was used to remove the residual cortex.  DisCoVisc was used to inflate the capsule and the internal wound was enlarged to  3 mm with the crescent knife.  The intraocular lens was inserted into the capsular bag using the Acrysert.  Irrigation/aspiration was used to remove the residual DisCoVisc.  Miostat was injected into the anterior chamber through the paracentesis track to inflate the anterior chamber and induce miosis.  The wound was checked for leaks and wound leakage was found.  A single 10-0 suture was placed across the incision, tied and the knot was rotated superiorly.  The conjunctiva was closed with cautery and the bridle sutures were removed.  1/10 mL of cefuroxime containing 1 mg was injected through the paracentesis tract.  An eye shield was placed on the eye.  The patient was discharged to the recovery room in good condition.  ____________________________ Maylon Peppers Aristotle Lieb, MD sad:AT D: 03/11/2015 12:56:42 ET T: 03/11/2015 17:34:01 ET JOB#: [id number]  cc: Viviann Spare A. Jolayne Branson, MD, <Dictator> Erline Levine MD ELECTRONICALLY SIGNED 03/18/2015 13:24

## 2015-04-02 ENCOUNTER — Encounter: Payer: Self-pay | Admitting: Internal Medicine

## 2015-04-10 ENCOUNTER — Encounter: Payer: Self-pay | Admitting: *Deleted

## 2015-04-10 DIAGNOSIS — Z7982 Long term (current) use of aspirin: Secondary | ICD-10-CM | POA: Diagnosis not present

## 2015-04-10 DIAGNOSIS — Z87891 Personal history of nicotine dependence: Secondary | ICD-10-CM | POA: Diagnosis not present

## 2015-04-10 DIAGNOSIS — E780 Pure hypercholesterolemia: Secondary | ICD-10-CM | POA: Diagnosis not present

## 2015-04-10 DIAGNOSIS — Z9889 Other specified postprocedural states: Secondary | ICD-10-CM | POA: Diagnosis not present

## 2015-04-10 DIAGNOSIS — Z79899 Other long term (current) drug therapy: Secondary | ICD-10-CM | POA: Diagnosis not present

## 2015-04-10 DIAGNOSIS — H2512 Age-related nuclear cataract, left eye: Secondary | ICD-10-CM | POA: Diagnosis present

## 2015-04-15 ENCOUNTER — Ambulatory Visit
Admission: RE | Admit: 2015-04-15 | Discharge: 2015-04-15 | Disposition: A | Discharge status: Home / Self Care | Payer: Medicare Other | Source: Ambulatory Visit | Attending: Ophthalmology | Admitting: Ophthalmology

## 2015-04-15 ENCOUNTER — Ambulatory Visit: Payer: Medicare Other | Admitting: Anesthesiology

## 2015-04-15 ENCOUNTER — Encounter
Admission: RE | Disposition: A | Discharge status: Home / Self Care | Payer: Self-pay | Source: Ambulatory Visit | Attending: Ophthalmology

## 2015-04-15 ENCOUNTER — Encounter: Payer: Self-pay | Admitting: *Deleted

## 2015-04-15 DIAGNOSIS — Z87891 Personal history of nicotine dependence: Secondary | ICD-10-CM | POA: Insufficient documentation

## 2015-04-15 DIAGNOSIS — E780 Pure hypercholesterolemia: Secondary | ICD-10-CM | POA: Insufficient documentation

## 2015-04-15 DIAGNOSIS — H2512 Age-related nuclear cataract, left eye: Secondary | ICD-10-CM | POA: Diagnosis not present

## 2015-04-15 DIAGNOSIS — Z7982 Long term (current) use of aspirin: Secondary | ICD-10-CM | POA: Insufficient documentation

## 2015-04-15 DIAGNOSIS — Z9889 Other specified postprocedural states: Secondary | ICD-10-CM | POA: Insufficient documentation

## 2015-04-15 DIAGNOSIS — Z79899 Other long term (current) drug therapy: Secondary | ICD-10-CM | POA: Insufficient documentation

## 2015-04-15 HISTORY — PX: CATARACT EXTRACTION W/PHACO: SHX586

## 2015-04-15 SURGERY — PHACOEMULSIFICATION, CATARACT, WITH IOL INSERTION
Anesthesia: Monitor Anesthesia Care | Laterality: Left

## 2015-04-15 MED ORDER — MOXIFLOXACIN HCL 0.5 % OP SOLN
1.0000 [drp] | OPHTHALMIC | Status: AC
  Administered 2015-04-15 (×3): 1 [drp] via OPHTHALMIC

## 2015-04-15 MED ORDER — TETRACAINE HCL 0.5 % OP SOLN
OPHTHALMIC | Status: DC | PRN
  Administered 2015-04-15: 1 [drp] via OPHTHALMIC

## 2015-04-15 MED ORDER — EPINEPHRINE HCL 1 MG/ML IJ SOLN
INTRAOCULAR | Status: DC | PRN
  Administered 2015-04-15: 200 mL

## 2015-04-15 MED ORDER — CEFUROXIME OPHTHALMIC INJECTION 1 MG/0.1 ML
INJECTION | OPHTHALMIC | Status: DC | PRN
  Administered 2015-04-15: 0.1 mL via INTRACAMERAL

## 2015-04-15 MED ORDER — PHENYLEPHRINE HCL 10 % OP SOLN
1.0000 [drp] | OPHTHALMIC | Status: AC
  Administered 2015-04-15 (×4): 1 [drp] via OPHTHALMIC

## 2015-04-15 MED ORDER — MOXIFLOXACIN HCL 0.5 % OP SOLN - NO CHARGE
OPHTHALMIC | Status: DC | PRN
  Administered 2015-04-15: 1 [drp] via OPHTHALMIC

## 2015-04-15 MED ORDER — CEFUROXIME OPHTHALMIC INJECTION 1 MG/0.1 ML
INJECTION | OPHTHALMIC | Status: AC
  Filled 2015-04-15: qty 0.1

## 2015-04-15 MED ORDER — CYCLOPENTOLATE HCL 2 % OP SOLN
OPHTHALMIC | Status: AC
  Administered 2015-04-15: 1 [drp] via OPHTHALMIC
  Filled 2015-04-15: qty 2

## 2015-04-15 MED ORDER — NA CHONDROIT SULF-NA HYALURON 40-17 MG/ML IO SOLN
INTRAOCULAR | Status: AC
  Filled 2015-04-15: qty 1

## 2015-04-15 MED ORDER — CYCLOPENTOLATE HCL 2 % OP SOLN
1.0000 [drp] | OPHTHALMIC | Status: AC
  Administered 2015-04-15 (×4): 1 [drp] via OPHTHALMIC

## 2015-04-15 MED ORDER — PHENYLEPHRINE HCL 10 % OP SOLN
OPHTHALMIC | Status: AC
  Administered 2015-04-15: 1 [drp] via OPHTHALMIC
  Filled 2015-04-15: qty 5

## 2015-04-15 MED ORDER — CARBACHOL 0.01 % IO SOLN
INTRAOCULAR | Status: DC | PRN
  Administered 2015-04-15: 0.5 mL via INTRAOCULAR

## 2015-04-15 MED ORDER — EPINEPHRINE HCL 1 MG/ML IJ SOLN
INTRAMUSCULAR | Status: AC
  Filled 2015-04-15: qty 1

## 2015-04-15 MED ORDER — HYALURONIDASE HUMAN 150 UNIT/ML IJ SOLN
INTRAMUSCULAR | Status: AC
  Filled 2015-04-15: qty 1

## 2015-04-15 MED ORDER — LIDOCAINE HCL (PF) 4 % IJ SOLN
INTRAMUSCULAR | Status: DC | PRN
  Administered 2015-04-15: 4 mL

## 2015-04-15 MED ORDER — MIDAZOLAM HCL 2 MG/2ML IJ SOLN
INTRAMUSCULAR | Status: DC | PRN
  Administered 2015-04-15: 1 mg via INTRAVENOUS

## 2015-04-15 MED ORDER — TETRACAINE HCL 0.5 % OP SOLN
OPHTHALMIC | Status: AC
  Filled 2015-04-15: qty 2

## 2015-04-15 MED ORDER — LIDOCAINE HCL (PF) 4 % IJ SOLN
INTRAMUSCULAR | Status: AC
  Filled 2015-04-15: qty 5

## 2015-04-15 MED ORDER — ALFENTANIL 500 MCG/ML IJ INJ
INJECTION | INTRAMUSCULAR | Status: DC | PRN
  Administered 2015-04-15: 500 ug via INTRAVENOUS

## 2015-04-15 MED ORDER — MOXIFLOXACIN HCL 0.5 % OP SOLN
OPHTHALMIC | Status: AC
  Administered 2015-04-15: 1 [drp] via OPHTHALMIC
  Filled 2015-04-15: qty 3

## 2015-04-15 MED ORDER — BUPIVACAINE HCL (PF) 0.75 % IJ SOLN
INTRAMUSCULAR | Status: AC
  Filled 2015-04-15: qty 10

## 2015-04-15 MED ORDER — NA CHONDROIT SULF-NA HYALURON 40-17 MG/ML IO SOLN
INTRAOCULAR | Status: DC | PRN
  Administered 2015-04-15: 1 mL via INTRAOCULAR

## 2015-04-15 MED ORDER — SODIUM CHLORIDE 0.9 % IV SOLN
INTRAVENOUS | Status: DC
  Administered 2015-04-15: 09:00:00 via INTRAVENOUS

## 2015-04-15 SURGICAL SUPPLY — 29 items
ACTIVE FMS ×3 IMPLANT
CORD BIP STRL DISP 12FT (MISCELLANEOUS) ×3 IMPLANT
DRAPE XRAY CASSETTE 23X24 (DRAPES) ×3 IMPLANT
ERASER HMR WETFIELD 18G (MISCELLANEOUS) ×3 IMPLANT
GLOVE BIO SURGEON STRL SZ8 (GLOVE) ×3 IMPLANT
GLOVE SURG LX 6.5 MICRO (GLOVE) ×2
GLOVE SURG LX 8.0 MICRO (GLOVE) ×2
GLOVE SURG LX STRL 6.5 MICRO (GLOVE) ×1 IMPLANT
GLOVE SURG LX STRL 8.0 MICRO (GLOVE) ×1 IMPLANT
GOWN STRL REUS W/ TWL LRG LVL3 (GOWN DISPOSABLE) ×1 IMPLANT
GOWN STRL REUS W/ TWL XL LVL3 (GOWN DISPOSABLE) ×1 IMPLANT
GOWN STRL REUS W/TWL LRG LVL3 (GOWN DISPOSABLE) ×2
GOWN STRL REUS W/TWL XL LVL3 (GOWN DISPOSABLE) ×2
KNIFE SIDECUT EYE (MISCELLANEOUS) ×3 IMPLANT
LENS IOL ACRYSERT 16.5 (Intraocular Lens) ×3 IMPLANT
PACK CATARACT (MISCELLANEOUS) ×3 IMPLANT
PACK CATARACT DINGLEDEIN LX (MISCELLANEOUS) ×3 IMPLANT
PACK EYE AFTER SURG (MISCELLANEOUS) ×3 IMPLANT
SHLD EYE VISITEC  UNIV (MISCELLANEOUS) ×3 IMPLANT
SN6CWS16.5 ×3 IMPLANT
SOL PREP PVP 2OZ (MISCELLANEOUS) ×3
SOLUTION PREP PVP 2OZ (MISCELLANEOUS) ×1 IMPLANT
SUT ETHILON 10 0 CS140 6 (SUTURE) ×3 IMPLANT
SUT SILK 5-0 (SUTURE) ×3 IMPLANT
SYR 5ML LL (SYRINGE) ×3 IMPLANT
SYR TB 1ML 27GX1/2 LL (SYRINGE) ×3 IMPLANT
WATER STERILE IRR 1000ML POUR (IV SOLUTION) ×3 IMPLANT
WIPE NON LINTING 3.25X3.25 (MISCELLANEOUS) ×3 IMPLANT
sn6cws16.5 cataract lens ×3 IMPLANT

## 2015-04-15 NOTE — Anesthesia Procedure Notes (Signed)
Procedure Name: MAC Date/Time: 04/15/2015 9:53 AM Performed by: Junious Silk Pre-anesthesia Checklist: Patient identified, Emergency Drugs available, Suction available, Patient being monitored and Timeout performed Oxygen Delivery Method: Nasal cannula

## 2015-04-15 NOTE — Discharge Instructions (Addendum)
Eye Surgery Discharge Instructions  Expect mild scratchy sensation or mild soreness. DO NOT RUB YOUR EYE!  The day of surgery:  Minimal physical activity, but bed rest is not required  No reading, computer work, or close hand work  No bending, lifting, or straining.  May watch TV  For 24 hours:  No driving, legal decisions, or alcoholic beverages  Safety precautions  Eat anything you prefer: It is better to start with liquids, then soup then solid foods.  _____ Eye patch should be worn until postoperative exam tomorrow.  ____ Solar shield eyeglasses should be worn for comfort in the sunlight/patch while sleeping  Resume all regular medications including aspirin or Coumadin if these were discontinued prior to surgery. You may shower, bathe, shave, or wash your hair. Tylenol may be taken for mild discomfort.  Call your doctor if you experience significant pain, nausea, or vomiting, fever > 101 or other signs of infection. [phone] or [phone] Specific instructions:  Follow-up Information    Follow up with Sallee Lange, MD.   Specialty:  Ophthalmology   Why:  10:40   Contact information:   [address]   [place] [address] [phone]

## 2015-04-15 NOTE — Interval H&P Note (Signed)
History and Physical Interval Note:  04/15/2015 9:46 AM  Richard Clayton  has presented today for surgery, with the diagnosis of Cataract  The various methods of treatment have been discussed with the patient and family. After consideration of risks, benefits and other options for treatment, the patient has consented to  Procedure(s): CATARACT EXTRACTION PHACO AND INTRAOCULAR LENS PLACEMENT (IOC) (Left) as a surgical intervention .  The patient's history has been reviewed, patient examined, no change in status, stable for surgery.  I have reviewed the patient's chart and labs.  Questions were answered to the patient's satisfaction.     Thecla Forgione

## 2015-04-15 NOTE — Anesthesia Preprocedure Evaluation (Signed)
Anesthesia Evaluation  Patient identified by MRN, date of birth, ID band Patient awake    Reviewed: Allergy & Precautions, H&P , NPO status , Patient's Chart, lab work & pertinent test results, reviewed documented beta blocker date and time   Airway Mallampati: II  TM Distance: >3 FB Neck ROM: full    Dental no notable dental hx.    Pulmonary neg pulmonary ROS, former smoker,  breath sounds clear to auscultation  Pulmonary exam normal       Cardiovascular Exercise Tolerance: Good negative cardio ROS  Rhythm:regular Rate:Normal     Neuro/Psych negative neurological ROS  negative psych ROS   GI/Hepatic negative GI ROS, Neg liver ROS,   Endo/Other  negative endocrine ROS  Renal/GU negative Renal ROS  negative genitourinary   Musculoskeletal   Abdominal   Peds  Hematology negative hematology ROS (+)   Anesthesia Other Findings   Reproductive/Obstetrics negative OB ROS                             Anesthesia Physical Anesthesia Plan  ASA: II  Anesthesia Plan: MAC   Post-op Pain Management:    Induction:   Airway Management Planned:   Additional Equipment:   Intra-op Plan:   Post-operative Plan:   Informed Consent: I have reviewed the patients History and Physical, chart, labs and discussed the procedure including the risks, benefits and alternatives for the proposed anesthesia with the patient or authorized representative who has indicated his/her understanding and acceptance.   Dental Advisory Given  Plan Discussed with: CRNA  Anesthesia Plan Comments:         Anesthesia Quick Evaluation  

## 2015-04-15 NOTE — Anesthesia Postprocedure Evaluation (Signed)
  Anesthesia Post-op Note  Patient: Tree surgeon  Procedure(s) Performed: Procedure(s) with comments: CATARACT EXTRACTION PHACO AND INTRAOCULAR LENS PLACEMENT (IOC) (Left) - [place] 00:47 AP% 22.7 CDE 20.56 Note:suture placed in left eye at end of procedure  Anesthesia type:MAC  Patient location: PACU  Post pain: Pain level controlled  Post assessment: Post-op Vital signs reviewed, Patient's Cardiovascular Status Stable, Respiratory Function Stable, Patent Airway and No signs of Nausea or vomiting  Post vital signs: Reviewed and stable  Last Vitals:  Filed Vitals:   04/15/15 1038  BP: 132/74  Pulse:   Temp: 35.7 C  Resp: 16    Level of consciousness: awake, alert  and patient cooperative  Complications: No apparent anesthesia complications

## 2015-04-15 NOTE — Transfer of Care (Signed)
Immediate Anesthesia Transfer of Care Note  Patient: Richard Clayton  Procedure(s) Performed: Procedure(s) with comments: CATARACT EXTRACTION PHACO AND INTRAOCULAR LENS PLACEMENT (IOC) (Left) - [place] 00:47 AP% 22.7 CDE 20.56 Note:suture placed in left eye at end of procedure  Patient Location: PACU  Anesthesia Type:MAC  Level of Consciousness: awake, alert  and oriented  Airway & Oxygen Therapy: Patient Spontanous Breathing  Post-op Assessment: Report given to RN and Post -op Vital signs reviewed and stable  Post vital signs: Reviewed and stable  Last Vitals:  Filed Vitals:   04/15/15 0818  BP: 114/82  Pulse: 64  Temp: 36.3 C  Resp: 14    Complications: No apparent anesthesia complications

## 2015-04-15 NOTE — H&P (Signed)
  History and physical was faxed and scanned in.   

## 2015-04-15 NOTE — Op Note (Signed)
Date of Surgery: 04/15/2015 Date of Dictation: 04/15/2015 10:34 AM Pre-operative Diagnosis:Nuclear Sclerotic Cataract, Posterior Subcapsular Cataract, Cortical Cataract and Mature Cataract left Eye Post-operative Diagnosis: same Procedure performed: Extra-capsular Cataract Extraction (ECCE) with placement of a posterior chamber intraocular lens (IOL) left Eye IOL:  Implant Name Type Inv. Item Serial No. Manufacturer Lot No. LRB No. Used  [id number] cataract lens     [id number]     Left 1   Anesthesia: 2% Lidocaine and 4% Marcaine in a 50/50 mixture with 10 unites/ml of Hylenex given as a peribulbar Anesthesiologist: Anesthesiologist: Yevette Edwards, MD CRNA: Darrol Jump, CRNA; Junious Silk, CRNA Complications: none Estimated Blood Loss: less than 1 ml  Description of procedure:  The patient was given anesthesia and sedation via intravenous access. The patient was then prepped and draped in the usual fashion. A 25-gauge needle was bent for initiating the capsulorhexis. A 5-0 silk suture was placed through the conjunctiva superior and inferiorly to serve as bridle sutures. Hemostasis was obtained at the superior limbus using an eraser cautery. A partial thickness groove was made at the anterior surgical limbus with a 64 Beaver blade and this was dissected anteriorly with an [employer]. The anterior chamber was entered at 10 o'clock with a 1.0 mm paracentesis knife and through the lamellar dissection with a 2.6 mm Alcon keratome. DiscoVisc was injected to replace the aqueous and a continuous tear curvilinear capsulorhexis was performed using a bent 25-gauge needle.  Balance salt on a syringe was used to perform hydro-dissection and phacoemulsification was carried out using a divide and conquer technique. Procedure(s) with comments: CATARACT EXTRACTION PHACO AND INTRAOCULAR LENS PLACEMENT (IOC) (Left) - [place] 00:47 AP% 22.7 CDE 20.56 Note:suture placed in left eye at end of procedure.  Irrigation/aspiration was used to remove the residual cortex and the capsular bag was inflated with DiscoVisc. The intraocular lens was inserted into the capsular bag using a pre-loaded Acrysert Delivery System. Irrigation/aspiration was used to remove the residual DiscoVisc. The wound was inflated with balanced salt and checked for leaks. None were found. Miostat was injected via the paracentesis track and 0.1 ml of cefuroxime containing 1 mg of drug  was injected via the paracentesis track. The wound was checked for leaks again and there was a mild persistent leak. A single 10-0 nylon suture was placed across the room and it was tied, trimmed and the knot buried superiorly. The wound was checked and there was no leak.  The bridal sutures were removed and two drops of Vigamox were placed on the eye. An eye shield was placed to protect the eye and the patient was discharged to the recovery area in good condition.   Jazmaine Fuelling MD

## 2015-04-16 ENCOUNTER — Encounter: Payer: Self-pay | Admitting: Ophthalmology

## 2015-05-29 ENCOUNTER — Telehealth: Payer: Self-pay | Admitting: *Deleted

## 2015-05-29 ENCOUNTER — Telehealth: Payer: Self-pay | Admitting: Internal Medicine

## 2015-05-29 DIAGNOSIS — D509 Iron deficiency anemia, unspecified: Secondary | ICD-10-CM

## 2015-05-29 NOTE — Telephone Encounter (Signed)
Anytime

## 2015-05-29 NOTE — Telephone Encounter (Signed)
The week AFTER he has donated blood is when he needs to come by for bloodwork .  Labs ordered

## 2015-05-29 NOTE — Telephone Encounter (Signed)
Patient is donating blood 06/05/15 patient ask when should he come in for a CBC per MD at last visit?

## 2015-05-30 ENCOUNTER — Other Ambulatory Visit: Payer: Self-pay | Admitting: Internal Medicine

## 2015-05-30 NOTE — Telephone Encounter (Signed)
Patient notified and lab appointment set up. 

## 2015-06-12 ENCOUNTER — Other Ambulatory Visit (INDEPENDENT_AMBULATORY_CARE_PROVIDER_SITE_OTHER): Payer: Medicare Other

## 2015-06-12 DIAGNOSIS — D509 Iron deficiency anemia, unspecified: Secondary | ICD-10-CM

## 2015-06-12 LAB — CBC WITH DIFFERENTIAL/PLATELET
BASOS ABS: 0.1 10*3/uL (ref 0.0–0.1)
BASOS PCT: 1.2 % (ref 0.0–3.0)
Eosinophils Absolute: 0.2 10*3/uL (ref 0.0–0.7)
Eosinophils Relative: 3.7 % (ref 0.0–5.0)
HCT: 41.9 % (ref 39.0–52.0)
HEMOGLOBIN: 14.1 g/dL (ref 13.0–17.0)
Lymphocytes Relative: 27.2 % (ref 12.0–46.0)
Lymphs Abs: 1.4 10*3/uL (ref 0.7–4.0)
MCHC: 33.8 g/dL (ref 30.0–36.0)
MCV: 89.6 fl (ref 78.0–100.0)
MONOS PCT: 8.3 % (ref 3.0–12.0)
Monocytes Absolute: 0.4 10*3/uL (ref 0.1–1.0)
NEUTROS ABS: 3 10*3/uL (ref 1.4–7.7)
Neutrophils Relative %: 59.6 % (ref 43.0–77.0)
Platelets: 207 10*3/uL (ref 150.0–400.0)
RBC: 4.68 Mil/uL (ref 4.22–5.81)
RDW: 12.6 % (ref 11.5–15.5)
WBC: 5 10*3/uL (ref 4.0–10.5)

## 2015-06-12 LAB — IRON AND TIBC
%SAT: 22 % (ref 20–55)
Iron: 81 ug/dL (ref 42–165)
TIBC: 367 ug/dL (ref 215–435)
UIBC: 286 ug/dL (ref 125–400)

## 2015-06-12 LAB — FERRITIN: Ferritin: 22.2 ng/mL (ref 22.0–322.0)

## 2015-06-13 ENCOUNTER — Encounter: Payer: Self-pay | Admitting: *Deleted

## 2015-08-21 ENCOUNTER — Encounter: Payer: Self-pay | Admitting: *Deleted

## 2015-08-21 ENCOUNTER — Encounter: Payer: Self-pay | Admitting: Internal Medicine

## 2015-08-21 ENCOUNTER — Ambulatory Visit (INDEPENDENT_AMBULATORY_CARE_PROVIDER_SITE_OTHER): Payer: Medicare PPO | Admitting: Internal Medicine

## 2015-08-21 VITALS — BP 112/78 | HR 68 | Temp 98.2°F | Resp 12 | Ht 73.0 in | Wt 170.4 lb

## 2015-08-21 DIAGNOSIS — R5383 Other fatigue: Secondary | ICD-10-CM | POA: Diagnosis not present

## 2015-08-21 DIAGNOSIS — Z1211 Encounter for screening for malignant neoplasm of colon: Secondary | ICD-10-CM | POA: Diagnosis not present

## 2015-08-21 DIAGNOSIS — E785 Hyperlipidemia, unspecified: Secondary | ICD-10-CM | POA: Diagnosis not present

## 2015-08-21 DIAGNOSIS — Z0000 Encounter for general adult medical examination without abnormal findings: Secondary | ICD-10-CM

## 2015-08-21 DIAGNOSIS — E559 Vitamin D deficiency, unspecified: Secondary | ICD-10-CM

## 2015-08-21 DIAGNOSIS — Z125 Encounter for screening for malignant neoplasm of prostate: Secondary | ICD-10-CM

## 2015-08-21 DIAGNOSIS — Z8619 Personal history of other infectious and parasitic diseases: Secondary | ICD-10-CM

## 2015-08-21 DIAGNOSIS — Z1159 Encounter for screening for other viral diseases: Secondary | ICD-10-CM

## 2015-08-21 DIAGNOSIS — Z23 Encounter for immunization: Secondary | ICD-10-CM | POA: Diagnosis not present

## 2015-08-21 LAB — CBC WITH DIFFERENTIAL/PLATELET
BASOS ABS: 0 10*3/uL (ref 0.0–0.1)
Basophils Relative: 0.9 % (ref 0.0–3.0)
EOS ABS: 0.1 10*3/uL (ref 0.0–0.7)
Eosinophils Relative: 2.1 % (ref 0.0–5.0)
HCT: 45 % (ref 39.0–52.0)
Hemoglobin: 15.2 g/dL (ref 13.0–17.0)
LYMPHS ABS: 1.1 10*3/uL (ref 0.7–4.0)
Lymphocytes Relative: 23.3 % (ref 12.0–46.0)
MCHC: 33.8 g/dL (ref 30.0–36.0)
MCV: 88.6 fl (ref 78.0–100.0)
MONO ABS: 0.4 10*3/uL (ref 0.1–1.0)
MONOS PCT: 8.7 % (ref 3.0–12.0)
NEUTROS ABS: 3 10*3/uL (ref 1.4–7.7)
NEUTROS PCT: 65 % (ref 43.0–77.0)
PLATELETS: 190 10*3/uL (ref 150.0–400.0)
RBC: 5.08 Mil/uL (ref 4.22–5.81)
RDW: 13.3 % (ref 11.5–15.5)
WBC: 4.7 10*3/uL (ref 4.0–10.5)

## 2015-08-21 LAB — COMPREHENSIVE METABOLIC PANEL
ALK PHOS: 92 U/L (ref 39–117)
ALT: 15 U/L (ref 0–53)
AST: 17 U/L (ref 0–37)
Albumin: 4.1 g/dL (ref 3.5–5.2)
BILIRUBIN TOTAL: 1 mg/dL (ref 0.2–1.2)
BUN: 12 mg/dL (ref 6–23)
CO2: 32 mEq/L (ref 19–32)
Calcium: 9.5 mg/dL (ref 8.4–10.5)
Chloride: 101 mEq/L (ref 96–112)
Creatinine, Ser: 0.94 mg/dL (ref 0.40–1.50)
GFR: 84.28 mL/min (ref >60.00)
Glucose, Bld: 83 mg/dL (ref 70–99)
Potassium: 4.5 mEq/L (ref 3.5–5.1)
Sodium: 137 mEq/L (ref 135–145)
Total Protein: 6.8 g/dL (ref 6.0–8.3)

## 2015-08-21 LAB — LIPID PANEL
Cholesterol: 229 mg/dL — ABNORMAL HIGH (ref 0–200)
HDL: 62.1 mg/dL (ref >39.00)
LDL Cholesterol: 153 mg/dL — ABNORMAL HIGH (ref 0–99)
NONHDL: 166.41
Total CHOL/HDL Ratio: 4
Triglycerides: 67 mg/dL (ref 0.0–149.0)
VLDL: 13.4 mg/dL (ref 0.0–40.0)

## 2015-08-21 LAB — VITAMIN D 25 HYDROXY (VIT D DEFICIENCY, FRACTURES): VITD: 32.08 ng/mL (ref 30.00–100.00)

## 2015-08-21 NOTE — Progress Notes (Signed)
Pre-visit discussion using our clinic review tool. No additional management support is needed unless otherwise documented below in the visit note.  

## 2015-08-21 NOTE — Progress Notes (Signed)
Patient ID: Richard Clayton, male    DOB: [birth year]  Age: 69 y.o. MRN: [id number]  The patient is here for annual Medicare wellness examination and management of other chronic and acute problems.   The risk factors are refected in the social history.  The roster of all physicians providing medical care to patient - is listed in the Snapshot section of the chart.  Activities of daily living:  The patient is 100% independent in all ADLs: dressing, toileting, feeding as well as independent mobility  Home safety : The patient has smoke detectors in the home. They wear seatbelts.  There are no firearms at home. There is no violence in the home.   There is no risks for hepatitis, STDs or HIV. There is no   history of blood transfusion. They have no travel history to infectious disease endemic areas of the world.  The patient has seen their dentist in the last six month. They have seen their eye doctor in the last year. They admit to slight hearing difficulty with regard to whispered voices and some television programs.  They have deferred audiologic testing in the last year.  They do not  have excessive sun exposure. Discussed the need for sun protection: hats, long sleeves and use of sunscreen if there is significant sun exposure.   Diet: the importance of a healthy diet is discussed. They do have a healthy diet.  The benefits of regular aerobic exercise were discussed. he walks and lifts weights 5 times per week ,  60 minutes.   Depression screen: there are no signs or vegative symptoms of depression- irritability, change in appetite, anhedonia, sadness/tearfullness.  Cognitive assessment: the patient manages all their financial and personal affairs and is actively engaged. They could relate day,date,year and events; recalled 2/3 objects at 3 minutes; performed clock-face test normally.  The following portions of the patient's history were reviewed and updated as appropriate: allergies, current  medications, past family history, past medical history,  past surgical history, past social history  and problem list.  Visual acuity was not assessed per patient preference since she has regular follow up with her ophthalmologist. Hearing and body mass index were assessed and reviewed.   During the course of the visit the patient was educated and counseled about appropriate screening and preventive services including : fall prevention , diabetes screening, nutrition counseling, colorectal cancer screening, and recommended immunizations.    CC: The primary encounter diagnosis was Encounter for immunization. Diagnoses of Colon cancer screening, Vitamin D deficiency, Need for hepatitis B screening test, Hyperlipidemia, Prostate cancer screening, Other fatigue, Need for prophylactic vaccination against Streptococcus pneumoniae (pneumococcus), Medicare annual wellness visit, subsequent, Hyperlipidemia with target LDL less than 160, and History of hepatitis B virus infection conferring immunity were also pertinent to this visit.  He was recently notified by the [facility] that he was ineligible for blood donation because his blood tested POSITIVE FOR HEPATITIS B CORE AG,  OTHER HEP B AB WERE NEGATIVE .  He has no history of blood transfusions but thinks he may have gotten infected or vaccinated while he was serving in [employer] in the 60's.  Some hemorrhoidal bleeding .    He has occasional constipation.  Stools are occasionally hard   Hyperlipidemia: he stopped taking lipitor on  august 7 due to persistent leg pain  ,  Leg pain improved when  he stopped using the treadmill  History Kamare has a past medical history of Hyperlipidemia.  He has past surgical history that includes Tonsilectomy/adenoidectomy with myringotomy (1950); Eye surgery; and Cataract extraction w/PHACO (Left, 04/15/2015).   His family history includes Cancer (age of onset: 46) in his father; Cancer (age of onset: 66) in  his sister; Hyperlipidemia in his mother.He reports that he quit smoking about 46 years ago. His smoking use included Cigarettes. He smoked 1.00 pack per day. He has never used smokeless tobacco. He reports that he drinks alcohol. He reports that he does not use illicit drugs.  Outpatient Prescriptions Prior to Visit  Medication Sig Dispense Refill  . cholecalciferol (VITAMIN D) 400 UNITS TABS tablet Take 2,000 Units by mouth daily.    [name] aspirin 81 MG tablet Take 81 mg by mouth daily.    . Multiple Vitamins tablet Take 1 tablet by mouth daily.    [name] LIPITOR 20 MG tablet TAKE 1 TABLET(20 MG) BY MOUTH DAILY (Patient not taking: Reported on 08/21/2015) 90 tablet 1  . zoster vaccine live, PF, (ZOSTAVAX) 16109 UNT/0.65ML injection Inject 19,400 Units into the skin once. (Patient not taking: Reported on 04/15/2015) 1 each 0   No facility-administered medications prior to visit.    Review of Systems   Patient denies headache, fevers, malaise, unintentional weight loss, skin rash, eye pain, sinus congestion and sinus pain, sore throat, dysphagia,  hemoptysis , cough, dyspnea, wheezing, chest pain, palpitations, orthopnea, edema, abdominal pain, nausea, melena, diarrhea, constipation, flank pain, dysuria, hematuria, urinary  Frequency, nocturia, numbness, tingling, seizures,  Focal weakness, Loss of consciousness,  Tremor, insomnia, depression, anxiety, and suicidal ideation.     Objective:  BP 112/78 mmHg  Pulse 68  Temp(Src) 98.2 F (36.8 C) (Oral)  Resp 12  Ht  (1.854 m)  Wt 170 lb 6 oz (77.282 kg)  BMI 22.48 kg/m2  SpO2 98%  Physical Exam  General appearance: alert, cooperative and appears stated age Ears: normal TM's and external ear canals both ears Throat: lips, mucosa, and tongue normal; teeth and gums normal Neck: no adenopathy, no carotid bruit, supple, symmetrical, trachea midline and thyroid not enlarged, symmetric, no tenderness/mass/nodules Back: symmetric, no curvature. ROM  normal. No CVA tenderness. Lungs: clear to auscultation bilaterally Heart: regular rate and rhythm, S1, S2 normal, no murmur, click, rub or gallop Abdomen: soft, non-tender; bowel sounds normal; no masses,  no organomegaly Pulses: 2+ and symmetric Skin: Skin color, texture, turgor normal. No rashes or lesions Lymph nodes: Cervical, supraclavicular, and axillary nodes normal.   Assessment & Plan:   Problem List Items Addressed This Visit      Unprioritized   Hyperlipidemia with target LDL less than 160    His hyperlipidemia is currently untreated due to statin intolerance.  Suggested a trial of red yeast rice and repeat lipids in 6 weeks.  Lab Results  Component Value Date   CHOL 229* 08/21/2015   HDL 62.10 08/21/2015   LDLCALC 153* 08/21/2015   TRIG 67.0 08/21/2015   CHOLHDL 4 08/21/2015   Lab Results  Component Value Date   ALT 15 08/21/2015   AST 17 08/21/2015   ALKPHOS 92 08/21/2015   BILITOT 1.0 08/21/2015         Medicare annual wellness visit, subsequent    Annual Medicare wellness  exam was done as well as a comprehensive physical exam and management of acute and chronic conditions .  During the course of the visit the patient was educated and counseled about appropriate screening and preventive services including : fall prevention , diabetes screening, nutrition  counseling, colorectal cancer screening, and recommended immunizations.  Printed recommendations for health maintenance screenings was given.       History of hepatitis B virus infection conferring immunity    Suggested by current panel of positive core ab and surface ab, with negative surface ag.   Lab Results  Component Value Date   HEPBCAB REACTIVE* 08/21/2015        Vitamin D deficiency   Relevant Orders   Vit D  25 hydroxy (rtn osteoporosis monitoring) (Completed)    Other Visit Diagnoses    Encounter for immunization    -  Primary    Colon cancer screening        Relevant Orders     Ambulatory referral to General Surgery    Need for hepatitis B screening test        Relevant Orders    Hepatitis B surface antibody (Completed)    Hepatitis B surface antigen (Completed)    Hepatitis B core antibody, total (Completed)    Hyperlipidemia        Relevant Orders    Lipid panel (Completed)    Prostate cancer screening        Other fatigue        Relevant Orders    Comprehensive metabolic panel (Completed)    CBC with Differential/Platelet (Completed)    Need for prophylactic vaccination against Streptococcus pneumoniae (pneumococcus)        Relevant Orders    Pneumococcal polysaccharide vaccine 23-valent greater than or equal to 2yo subcutaneous/IM (Completed)       I have discontinued Mr. Bialecki zoster vaccine live (PF) and LIPITOR. I am also having him maintain his aspirin, Multiple Vitamins, and cholecalciferol.  No orders of the defined types were placed in this encounter.    Medications Discontinued During This Encounter  Medication Reason  . zoster vaccine live, PF, (ZOSTAVAX) 36067 UNT/0.65ML injection Completed Course  . zoster vaccine live, PF, (ZOSTAVAX) 70340 UNT/0.65ML injection Completed Course  . LIPITOR 20 MG tablet     Follow-up: No Follow-up on file.   [name] Shams, MD

## 2015-08-21 NOTE — Patient Instructions (Addendum)
You can try taking 100 mg docusate (Colace) daily to see if your hemorrhoids stop bleeding   Referral to Dr Lemar Livings for colonoscopy is underway  If your cholesterol is elevated,  You can try using Red Yeast Rice 600 mg 2 capsules daily to lower your LDL   Repeat testing for Hepatitis B is underway  You received the flu and the Pneumovax vaccines today  Health Maintenance A healthy lifestyle and preventative care can promote health and wellness.  Maintain regular health, dental, and eye exams.  Eat a healthy diet. Foods like vegetables, fruits, whole grains, low-fat dairy products, and lean protein foods contain the nutrients you need and are low in calories. Decrease your intake of foods high in solid fats, added sugars, and salt. Get information about a proper diet from your health care provider, if necessary.  Regular physical exercise is one of the most important things you can do for your health. Most adults should get at least 150 minutes of moderate-intensity exercise (any activity that increases your heart rate and causes you to sweat) each week. In addition, most adults need muscle-strengthening exercises on 2 or more days a week.   Maintain a healthy weight. The body mass index (BMI) is a screening tool to identify possible weight problems. It provides an estimate of body fat based on height and weight. Your health care provider can find your BMI and can help you achieve or maintain a healthy weight. For males 20 years and older:  A BMI below 18.5 is considered underweight.  A BMI of 18.5 to 24.9 is normal.  A BMI of 25 to 29.9 is considered overweight.  A BMI of 30 and above is considered obese.  Maintain normal blood lipids and cholesterol by exercising and minimizing your intake of saturated fat. Eat a balanced diet with plenty of fruits and vegetables. Blood tests for lipids and cholesterol should begin at age 57 and be repeated every 5 years. If your lipid or cholesterol  levels are high, you are over age 4, or you are at high risk for heart disease, you may need your cholesterol levels checked more frequently.Ongoing high lipid and cholesterol levels should be treated with medicines if diet and exercise are not working.  If you smoke, find out from your health care provider how to quit. If you do not use tobacco, do not start.  Lung cancer screening is recommended for adults aged 55-80 years who are at high risk for developing lung cancer because of a history of smoking. A yearly low-dose CT scan of the lungs is recommended for people who have at least a 30-pack-year history of smoking and are current smokers or have quit within the past 15 years. A pack year of smoking is smoking an average of 1 pack of cigarettes a day for 1 year (for example, a 30-pack-year history of smoking could mean smoking 1 pack a day for 30 years or 2 packs a day for 15 years). Yearly screening should continue until the smoker has stopped smoking for at least 15 years. Yearly screening should be stopped for people who develop a health problem that would prevent them from having lung cancer treatment.  If you choose to drink alcohol, do not have more than 2 drinks per day. One drink is considered to be 12 oz (360 mL) of beer, 5 oz (150 mL) of wine, or 1.5 oz (45 mL) of liquor.  Avoid the use of street drugs. Do not share needles with  anyone. Ask for help if you need support or instructions about stopping the use of drugs.  High blood pressure causes heart disease and increases the risk of stroke. Blood pressure should be checked at least every 1-2 years. Ongoing high blood pressure should be treated with medicines if weight loss and exercise are not effective.  If you are 65-62 years old, ask your health care provider if you should take aspirin to prevent heart disease.  Diabetes screening involves taking a blood sample to check your fasting blood sugar level. This should be done once every  3 years after age 20 if you are at a normal weight and without risk factors for diabetes. Testing should be considered at a younger age or be carried out more frequently if you are overweight and have at least 1 risk factor for diabetes.  Colorectal cancer can be detected and often prevented. Most routine colorectal cancer screening begins at the age of 38 and continues through age 25. However, your health care provider may recommend screening at an earlier age if you have risk factors for colon cancer. On a yearly basis, your health care provider may provide home test kits to check for hidden blood in the stool. A small camera at the end of a tube may be used to directly examine the colon (sigmoidoscopy or colonoscopy) to detect the earliest forms of colorectal cancer. Talk to your health care provider about this at age 55 when routine screening begins. A direct exam of the colon should be repeated every 5-10 years through age 2, unless early forms of precancerous polyps or small growths are found.  People who are at an increased risk for hepatitis B should be screened for this virus. You are considered at high risk for hepatitis B if:  You were born in a country where hepatitis B occurs often. Talk with your health care provider about which countries are considered high risk.  Your parents were born in a high-risk country and you have not received a shot to protect against hepatitis B (hepatitis B vaccine).  You have HIV or AIDS.  You use needles to inject street drugs.  You live with, or have sex with, someone who has hepatitis B.  You are a man who has sex with other men (MSM).  You get hemodialysis treatment.  You take certain medicines for conditions like cancer, organ transplantation, and autoimmune conditions.  Hepatitis C blood testing is recommended for all people born from [birth year] through 1966 and any individual with known risk factors for hepatitis C.  Healthy men should no longer  receive prostate-specific antigen (PSA) blood tests as part of routine cancer screening. Talk to your health care provider about prostate cancer screening.  Testicular cancer screening is not recommended for adolescents or adult males who have no symptoms. Screening includes self-exam, a health care provider exam, and other screening tests. Consult with your health care provider about any symptoms you have or any concerns you have about testicular cancer.  Practice safe sex. Use condoms and avoid high-risk sexual practices to reduce the spread of sexually transmitted infections (STIs).  You should be screened for STIs, including gonorrhea and chlamydia if:  You are sexually active and are younger than 24 years.  You are older than 24 years, and your health care provider tells you that you are at risk for this type of infection.  Your sexual activity has changed since you were last screened, and you are at an increased risk for  chlamydia or gonorrhea. Ask your health care provider if you are at risk.  If you are at risk of being infected with HIV, it is recommended that you take a prescription medicine daily to prevent HIV infection. This is called pre-exposure prophylaxis (PrEP). You are considered at risk if:  You are a man who has sex with other men (MSM).  You are a heterosexual man who is sexually active with multiple partners.  You take drugs by injection.  You are sexually active with a partner who has HIV.  Talk with your health care provider about whether you are at high risk of being infected with HIV. If you choose to begin PrEP, you should first be tested for HIV. You should then be tested every 3 months for as long as you are taking PrEP.  Use sunscreen. Apply sunscreen liberally and repeatedly throughout the day. You should seek shade when your shadow is shorter than you. Protect yourself by wearing long sleeves, pants, a wide-brimmed hat, and sunglasses year round whenever you  are outdoors.  Tell your health care provider of new moles or changes in moles, especially if there is a change in shape or color. Also, tell your health care provider if a mole is larger than the size of a pencil eraser.  A one-time screening for abdominal aortic aneurysm (AAA) and surgical repair of large AAAs by ultrasound is recommended for men aged 65-75 years who are current or former smokers.  Stay current with your vaccines (immunizations). Document Released: 05/14/2008 Document Revised: 11/21/2013 Document Reviewed: 04/13/2011 [facility] Patient Information 2015 [place], [place]. This information is not intended to replace advice given to you by your health care provider. Make sure you discuss any questions you have with your health care provider.

## 2015-08-22 LAB — HEPATITIS B CORE ANTIBODY, TOTAL: HEP B C TOTAL AB: REACTIVE — AB

## 2015-08-22 LAB — HEPATITIS B SURFACE ANTIGEN: Hepatitis B Surface Ag: NEGATIVE

## 2015-08-22 LAB — HEPATITIS B SURFACE ANTIBODY,QUALITATIVE: Hep B S Ab: POSITIVE — AB

## 2015-08-24 DIAGNOSIS — Z8619 Personal history of other infectious and parasitic diseases: Secondary | ICD-10-CM | POA: Insufficient documentation

## 2015-08-24 NOTE — Assessment & Plan Note (Signed)

## 2015-08-24 NOTE — Assessment & Plan Note (Signed)
His hyperlipidemia is currently untreated due to statin intolerance.  Suggested a trial of red yeast rice and repeat lipids in 6 weeks.  Lab Results  Component Value Date   CHOL 229* 08/21/2015   HDL 62.10 08/21/2015   LDLCALC 153* 08/21/2015   TRIG 67.0 08/21/2015   CHOLHDL 4 08/21/2015   Lab Results  Component Value Date   ALT 15 08/21/2015   AST 17 08/21/2015   ALKPHOS 92 08/21/2015   BILITOT 1.0 08/21/2015

## 2015-08-24 NOTE — Assessment & Plan Note (Signed)
Suggested by current panel of positive core ab and surface ab, with negative surface ag.   Lab Results  Component Value Date   HEPBCAB REACTIVE* 08/21/2015

## 2015-08-26 ENCOUNTER — Encounter: Payer: Self-pay | Admitting: General Surgery

## 2015-08-26 ENCOUNTER — Encounter: Payer: Self-pay | Admitting: *Deleted

## 2015-08-26 ENCOUNTER — Ambulatory Visit (INDEPENDENT_AMBULATORY_CARE_PROVIDER_SITE_OTHER): Payer: Medicare PPO | Admitting: General Surgery

## 2015-08-26 VITALS — BP 128/74 | HR 78 | Resp 12 | Ht 72.0 in | Wt 170.0 lb

## 2015-08-26 DIAGNOSIS — Z86010 Personal history of colonic polyps: Secondary | ICD-10-CM

## 2015-08-26 MED ORDER — POLYETHYLENE GLYCOL 3350 17 GM/SCOOP PO POWD: ORAL | Status: DC

## 2015-08-26 NOTE — Patient Instructions (Addendum)
Colonoscopy A colonoscopy is an exam to look at the entire large intestine (colon). This exam can help find problems such as tumors, polyps, inflammation, and areas of bleeding. The exam takes about 1 hour.  LET [facility] CARE PROVIDER KNOW ABOUT:   Any allergies you have.  All medicines you are taking, including vitamins, herbs, eye drops, creams, and over-the-counter medicines.  Previous problems you or members of your family have had with the use of anesthetics.  Any blood disorders you have.  Previous surgeries you have had.  Medical conditions you have. RISKS AND COMPLICATIONS  Generally, this is a safe procedure. However, as with any procedure, complications can occur. Possible complications include:  Bleeding.  Tearing or rupture of the colon wall.  Reaction to medicines given during the exam.  Infection (rare). BEFORE THE PROCEDURE   Ask your health care provider about changing or stopping your regular medicines.  You may be prescribed an oral bowel prep. This involves drinking a large amount of medicated liquid, starting the day before your procedure. The liquid will cause you to have multiple loose stools until your stool is almost clear or light green. This cleans out your colon in preparation for the procedure.  Do not eat or drink anything else once you have started the bowel prep, unless your health care provider tells you it is safe to do so.  Arrange for someone to drive you home after the procedure. PROCEDURE   You will be given medicine to help you relax (sedative).  You will lie on your side with your knees bent.  A long, flexible tube with a light and camera on the end (colonoscope) will be inserted through the rectum and into the colon. The camera sends video back to a computer screen as it moves through the colon. The colonoscope also releases carbon dioxide gas to inflate the colon. This helps your health care provider see the area better.  During  the exam, your health care provider may take a small tissue sample (biopsy) to be examined under a microscope if any abnormalities are found.  The exam is finished when the entire colon has been viewed. AFTER THE PROCEDURE   Do not drive for 24 hours after the exam.  You may have a small amount of blood in your stool.  You may pass moderate amounts of gas and have mild abdominal cramping or bloating. This is caused by the gas used to inflate your colon during the exam.  Ask when your test results will be ready and how you will get your results. Make sure you get your test results. Document Released: 11/13/2000 Document Revised: 09/06/2013 Document Reviewed: 07/24/2013 [facility] Patient Information 2015 [place], [place]. This information is not intended to replace advice given to you by your health care provider. Make sure you discuss any questions you have with your health care provider.  Patient has been scheduled for a colonoscopy on 09-04-15 at [facility].

## 2015-08-26 NOTE — Progress Notes (Signed)
Patient ID: Richard Clayton, male   DOB: [birth year], 69 y.o.   MRN: [id number]  Chief Complaint  Patient presents with  . Colonoscopy    HPI Richard Clayton is a 69 y.o. male here today for a evaluation of a screening colonoscopy. Patient last colonoscopy was done on 2011 in [place]. History of colon polyps. No GI problems at this time. Patient states he has hemorrhoids that are bleeding. Has been going on for years, but has been more pronounced over the last month. Blood typically is on the stool and drips into the toilet bowl. No associated pain. Not necessarily related to stool consistency.  At the time of the patient's last colonoscopy when he was in [place], polyps were identified and a five-year follow-up recommended. That physician has since retired and records were lost during [employer].  The procedure was completed at an outside facility, and a record request was sent with no record of the patient having been at the facility. HPI  Past Medical History  Diagnosis Date  . Hyperlipidemia   . Hemorrhoids     Past Surgical History  Procedure Laterality Date  . Tonsilectomy/adenoidectomy with myringotomy  1950  . Eye surgery      cataract  . Cataract extraction w/phaco Left 04/15/2015    Procedure: CATARACT EXTRACTION PHACO AND INTRAOCULAR LENS PLACEMENT (IOC);  Surgeon: Sallee Lange, MD;  Location: [facility] ORS;  Service: Ophthalmology;  Laterality: Left;  [place] 00:47 AP% 22.7 CDE 20.56 Note:suture placed in left eye at end of procedure  . Colonoscopy  2011    [place].    Family History  Problem Relation Age of Onset  . Hyperlipidemia Mother   . Cancer Father 42    from working in [employer]  . Cancer Sister 68    in remission    Social History Social History  Substance Use Topics  . Smoking status: Former Smoker -- 1.00 packs/day    Types: Cigarettes    Quit date: 12/13/1968  . Smokeless tobacco: Never Used  . Alcohol Use: Yes     Comment: wine in the  evenings    No Known Allergies  Current Outpatient Prescriptions  Medication Sig Dispense Refill  . cholecalciferol (VITAMIN D) 400 UNITS TABS tablet Take 2,000 Units by mouth daily.    . polyethylene glycol powder (GLYCOLAX/MIRALAX) powder 255 grams one bottle for colonoscopy prep 255 g 0   No current facility-administered medications for this visit.    Review of Systems Review of Systems  Constitutional: Negative.   Respiratory: Negative.   Cardiovascular: Negative.   Gastrointestinal: Negative.     Blood pressure 128/74, pulse 78, resp. rate 12, height 6' (1.829 m), weight 170 lb (77.111 kg).  Physical Exam Physical Exam  Constitutional: He is oriented to person, place, and time. He appears well-developed and well-nourished.  Cardiovascular: Normal rate, regular rhythm and normal heart sounds.   Pulmonary/Chest: Effort normal and breath sounds normal.  Neurological: He is alert and oriented to person, place, and time.  Skin: Skin is warm and dry.    Data Reviewed PCP notes.  Assessment    History colonic polyps [name] recent increasing rectal bleeding, likely anorectal source.    Plan       Colonoscopy with possible biopsy/polypectomy prn: Information regarding the procedure, including its potential risks and complications (including but not limited to perforation of the bowel, which may require emergency surgery to repair, and bleeding) was verbally given to the patient. Educational information regarding  lower intestinal endoscopy was given to the patient. Written instructions for how to complete the bowel prep using Miralax were provided. The importance of drinking ample fluids to avoid dehydration as a result of the prep emphasized.  Patient has been scheduled for a colonoscopy on 09-04-15 at [facility].   PCP:  Macky Lower 08/26/2015, 10:06 AM

## 2015-08-26 NOTE — H&P (Signed)
HPI  Richard Clayton is a 69 y.o. male here today for a evaluation of a screening colonoscopy. Patient last colonoscopy was done on 2011 in [place]. History of colon polyps. No GI problems at this time. Patient states he has hemorrhoids that are bleeding. Has been going on for years, but has been more pronounced over the last month. Blood typically is on the stool and drips into the toilet bowl. No associated pain. Not necessarily related to stool consistency.  At the time of the patient's last colonoscopy when he was in [place], polyps were identified and a five-year follow-up recommended. That physician has since retired and records were lost during [employer]. The procedure was completed at an outside facility, and a record request was sent with no record of the patient having been at the facility.  HPI  Past Medical History   Diagnosis  Date   .  Hyperlipidemia    .  Hemorrhoids     Past Surgical History   Procedure  Laterality  Date   .  Tonsilectomy/adenoidectomy with myringotomy   1950   .  Eye surgery       cataract   .  Cataract extraction w/phaco  Left  04/15/2015     Procedure: CATARACT EXTRACTION PHACO AND INTRAOCULAR LENS PLACEMENT (IOC); Surgeon: Richard Lange, MD; Location: [facility] ORS; Service: Ophthalmology; Laterality: Left; [place] 00:47  AP% 22.7  CDE 20.56  Note:suture placed in left eye at end of procedure   .  Colonoscopy   2011     [place].    Family History   Problem  Relation  Age of Onset   .  Hyperlipidemia  Mother    .  Cancer  Father  54     from working in [employer]   .  Cancer  Sister  70     in remission    Social History  Social History   Substance Use Topics   .  Smoking status:  Former Smoker -- 1.00 packs/day     Types:  Cigarettes     Quit date:  12/13/1968   .  Smokeless tobacco:  Never Used   .  Alcohol Use:  Yes      Comment: wine in the evenings    No Known Allergies  Current Outpatient Prescriptions   Medication  Sig   Dispense  Refill   .  cholecalciferol (VITAMIN D) 400 UNITS TABS tablet  Take 2,000 Units by mouth daily.     .  polyethylene glycol powder (GLYCOLAX/MIRALAX) powder  255 grams one bottle for colonoscopy prep  255 g  0    No current facility-administered medications for this visit.    Review of Systems  Review of Systems  Constitutional: Negative.  Respiratory: Negative.  Cardiovascular: Negative.  Gastrointestinal: Negative.   Blood pressure 128/74, pulse 78, resp. rate 12, height 6' (1.829 m), weight 170 lb (77.111 kg).  Physical Exam  Physical Exam  Constitutional: He is oriented to person, place, and time. He appears well-developed and well-nourished.  Cardiovascular: Normal rate, regular rhythm and normal heart sounds.  Pulmonary/Chest: Effort normal and breath sounds normal.  Neurological: He is alert and oriented to person, place, and time.  Skin: Skin is warm and dry.   Data Reviewed  PCP notes.  Assessment   History colonic polyps Richard Clayton recent increasing rectal bleeding, likely anorectal source.   Plan   Colonoscopy with possible biopsy/polypectomy prn: Information regarding the procedure, including its potential  risks and complications (including but not limited to perforation of the bowel, which may require emergency surgery to repair, and bleeding) was verbally given to the patient. Educational information regarding Clayton intestinal endoscopy was given to the patient. Written instructions for how to complete the bowel prep using Miralax were provided. The importance of drinking ample fluids to avoid dehydration as a result of the prep emphasized.  Patient has been scheduled for a colonoscopy on 09-04-15 at [facility].  PCP: Richard Clayton  08/26/2015, 10:06 AM

## 2015-09-04 ENCOUNTER — Encounter: Payer: Self-pay | Admitting: *Deleted

## 2015-09-04 ENCOUNTER — Encounter
Admission: RE | Disposition: A | Discharge status: Home / Self Care | Payer: Self-pay | Source: Ambulatory Visit | Attending: General Surgery

## 2015-09-04 ENCOUNTER — Ambulatory Visit
Admission: RE | Admit: 2015-09-04 | Discharge: 2015-09-04 | Disposition: A | Discharge status: Home / Self Care | Payer: Medicare PPO | Source: Ambulatory Visit | Attending: General Surgery | Admitting: General Surgery

## 2015-09-04 ENCOUNTER — Ambulatory Visit: Payer: Medicare PPO | Admitting: Anesthesiology

## 2015-09-04 DIAGNOSIS — Z86010 Personal history of colonic polyps: Secondary | ICD-10-CM | POA: Insufficient documentation

## 2015-09-04 DIAGNOSIS — Z87891 Personal history of nicotine dependence: Secondary | ICD-10-CM | POA: Diagnosis not present

## 2015-09-04 DIAGNOSIS — Z9889 Other specified postprocedural states: Secondary | ICD-10-CM | POA: Insufficient documentation

## 2015-09-04 DIAGNOSIS — E785 Hyperlipidemia, unspecified: Secondary | ICD-10-CM | POA: Diagnosis not present

## 2015-09-04 DIAGNOSIS — K5790 Diverticulosis of intestine, part unspecified, without perforation or abscess without bleeding: Secondary | ICD-10-CM | POA: Diagnosis not present

## 2015-09-04 DIAGNOSIS — K5730 Diverticulosis of large intestine without perforation or abscess without bleeding: Secondary | ICD-10-CM | POA: Insufficient documentation

## 2015-09-04 DIAGNOSIS — Z9842 Cataract extraction status, left eye: Secondary | ICD-10-CM | POA: Insufficient documentation

## 2015-09-04 HISTORY — PX: COLONOSCOPY WITH PROPOFOL: SHX5780

## 2015-09-04 SURGERY — COLONOSCOPY WITH PROPOFOL
Anesthesia: General

## 2015-09-04 MED ORDER — SODIUM CHLORIDE 0.9 % IV SOLN
INTRAVENOUS | Status: DC
  Administered 2015-09-04: 1000 mL via INTRAVENOUS

## 2015-09-04 MED ORDER — PROPOFOL 10 MG/ML IV BOLUS
INTRAVENOUS | Status: DC | PRN
  Administered 2015-09-04: 80 mg via INTRAVENOUS
  Administered 2015-09-04: 30 mg via INTRAVENOUS

## 2015-09-04 MED ORDER — PROPOFOL 500 MG/50ML IV EMUL
INTRAVENOUS | Status: DC | PRN
  Administered 2015-09-04: 150 ug/kg/min via INTRAVENOUS

## 2015-09-04 MED ORDER — LIDOCAINE HCL (CARDIAC) 20 MG/ML IV SOLN
INTRAVENOUS | Status: DC | PRN
  Administered 2015-09-04: 20 mg via INTRAVENOUS

## 2015-09-04 NOTE — Anesthesia Preprocedure Evaluation (Addendum)
Anesthesia Evaluation  Patient identified by MRN, date of birth, ID band Patient awake    Reviewed: Allergy & Precautions, H&P , NPO status , Patient's Chart, lab work & pertinent test results  History of Anesthesia Complications Negative for: history of anesthetic complications  Airway Mallampati: II  TM Distance: >3 FB Neck ROM: limited    Dental no notable dental hx. (+) Teeth Intact   Pulmonary neg shortness of breath, former smoker,    Pulmonary exam normal breath sounds clear to auscultation       Cardiovascular Exercise Tolerance: Good (-) Past MI negative cardio ROS Normal cardiovascular exam Rhythm:regular Rate:Normal     Neuro/Psych negative neurological ROS  negative psych ROS   GI/Hepatic negative GI ROS, Neg liver ROS, neg GERD  ,  Endo/Other  negative endocrine ROS  Renal/GU negative Renal ROS  negative genitourinary   Musculoskeletal   Abdominal   Peds  Hematology negative hematology ROS (+)   Anesthesia Other Findings Past Medical History:   Hyperlipidemia                                               Hemorrhoids                                                  Reproductive/Obstetrics negative OB ROS                            Anesthesia Physical Anesthesia Plan  ASA: II  Anesthesia Plan: General   Post-op Pain Management:    Induction:   Airway Management Planned:   Additional Equipment:   Intra-op Plan:   Post-operative Plan:   Informed Consent: I have reviewed the patients History and Physical, chart, labs and discussed the procedure including the risks, benefits and alternatives for the proposed anesthesia with the patient or authorized representative who has indicated his/her understanding and acceptance.   Dental Advisory Given  Plan Discussed with: Anesthesiologist, CRNA and Surgeon  Anesthesia Plan Comments:         Anesthesia Quick  Evaluation

## 2015-09-04 NOTE — Transfer of Care (Signed)
Immediate Anesthesia Transfer of Care Note  Patient: Richard Clayton  Procedure(s) Performed: Procedure(s): COLONOSCOPY WITH PROPOFOL (N/A)  Patient Location: Endoscopy Unit  Anesthesia Type:General  Level of Consciousness: sedated  Airway & Oxygen Therapy: Patient Spontanous Breathing and Patient connected to nasal cannula oxygen  Post-op Assessment: Report given to RN and Post -op Vital signs reviewed and stable  Post vital signs: Reviewed and stable  Last Vitals:  Filed Vitals:   09/04/15 1004  BP: 125/72  Pulse: 70  Temp: 36.5 C  Resp: 16    Complications: No apparent anesthesia complications

## 2015-09-04 NOTE — H&P (Signed)
  Law Zara [id number] Apr 17, 1946     HPI:  Past history colonic polyps. For follow-up colonoscopy. Tolerated prep well.    Current facility-administered medications:  .  0.9 %  sodium chloride infusion, , Intravenous, Continuous, Earline Mayotte, MD, Last Rate: 20 mL/hr at 09/04/15 1022, 1,000 mL at 09/04/15 1022 No Known Allergies Past Medical History  Diagnosis Date  . Hyperlipidemia   . Hemorrhoids    Past Surgical History  Procedure Laterality Date  . Tonsilectomy/adenoidectomy with myringotomy  1950  . Eye surgery      cataract  . Cataract extraction w/phaco Left 04/15/2015    Procedure: CATARACT EXTRACTION PHACO AND INTRAOCULAR LENS PLACEMENT (IOC);  Surgeon: Sallee Lange, MD;  Location: [facility] ORS;  Service: Ophthalmology;  Laterality: Left;  [place] 00:47 AP% 22.7 CDE 20.56 Note:suture placed in left eye at end of procedure  . Colonoscopy  2011    [place].  . Tonsillectomy     Social History   Social History  . Marital Status: Single    Spouse Name: N/A  . Number of Children: N/A  . Years of Education: N/A   Occupational History  . Not on file.   Social History Main Topics  . Smoking status: Former Smoker -- 1.00 packs/day    Types: Cigarettes    Quit date: 12/13/1968  . Smokeless tobacco: Never Used  . Alcohol Use: Yes     Comment: wine in the evenings  . Drug Use: No  . Sexual Activity: Yes   Other Topics Concern  . Not on file   Social History Narrative   Social History   Social History Narrative     ROS: Negative.     PE: HEENT: Negative. Lungs: Clear. Cardio: RR. Earline Mayotte 09/04/2015   Assessment/Plan:  Proceed with planned endoscopy.

## 2015-09-04 NOTE — Anesthesia Postprocedure Evaluation (Signed)
  Anesthesia Post-op Note  Patient: Richard Clayton  Procedure(s) Performed: Procedure(s): COLONOSCOPY WITH PROPOFOL (N/A)  Anesthesia type:General  Patient location: PACU  Post pain: Pain level controlled  Post assessment: Post-op Vital signs reviewed, Patient's Cardiovascular Status Stable, Respiratory Function Stable, Patent Airway and No signs of Nausea or vomiting  Post vital signs: Reviewed and stable  Last Vitals:  Filed Vitals:   09/04/15 1130  BP: 115/96  Pulse: 58  Temp:   Resp: 17    Level of consciousness: awake, alert  and patient cooperative  Complications: No apparent anesthesia complications

## 2015-09-04 NOTE — Op Note (Signed)
[facility] Patient Name: Richard Clayton Procedure Date: 09/04/2015 10:30 AM MRN: [id number] Account #: [id number] Date of Birth: Oct 04, 1946 Admit Type: Outpatient Age: 69 Room: [facility] ENDO ROOM 1 Gender: Male Note Status: Finalized Procedure:         Colonoscopy Indications:       High risk colon cancer surveillance: Personal history of                     colonic polyps Providers:         Earline Mayotte, MD Referring MD:      Duncan Dull, MD (Referring MD) Medicines:         Monitored Anesthesia Care Complications:     No immediate complications. Procedure:         Pre-Anesthesia Assessment:                    - Prior to the procedure, a History and Physical was                     performed, and patient medications, allergies and                     sensitivities were reviewed. The patient's tolerance of                     previous anesthesia was reviewed.                    - The risks and benefits of the procedure and the sedation                     options and risks were discussed with the patient. All                     questions were answered and informed consent was obtained.                    After obtaining informed consent, the colonoscope was                     passed under direct vision. Throughout the procedure, the                     patient's blood pressure, pulse, and oxygen saturations                     were monitored continuously. The Colonoscope was                     introduced through the anus and advanced to the the cecum,                     identified by appendiceal orifice and ileocecal valve. The                     colonoscopy was performed without difficulty. The patient                     tolerated the procedure well. The quality of the bowel                     preparation was excellent. Findings:      A few small-mouthed diverticula were found in the sigmoid colon.      The  retroflexed view of the  distal rectum and anal verge was normal and       showed no anal or rectal abnormalities. Impression:        - Diverticulosis in the sigmoid colon.                    - The distal rectum and anal verge are normal on                     retroflexion view.                    - No specimens collected. Recommendation:    - Repeat colonoscopy in 5 years for surveillance. Diagnosis Code(s): --- Professional ---                    K57.30, Diverticulosis of large intestine without                     perforation or abscess without bleeding                    Z86.010, Personal history of colonic polyps Earline Mayotte, MD 09/04/2015 10:55:51 AM This report has been signed electronically. Number of Addenda: 0 Note Initiated On: 09/04/2015 10:30 AM Scope Withdrawal Time: 0 hours 7 minutes 1 second  Total Procedure Duration: 0 hours 17 minutes 54 seconds       [facility]

## 2015-09-09 ENCOUNTER — Encounter: Payer: Self-pay | Admitting: General Surgery

## 2015-12-17 DIAGNOSIS — Z961 Presence of intraocular lens: Secondary | ICD-10-CM | POA: Diagnosis not present

## 2016-03-05 ENCOUNTER — Ambulatory Visit (INDEPENDENT_AMBULATORY_CARE_PROVIDER_SITE_OTHER): Payer: Medicare PPO | Admitting: Internal Medicine

## 2016-03-05 ENCOUNTER — Encounter: Payer: Self-pay | Admitting: Internal Medicine

## 2016-03-05 VITALS — BP 106/70 | HR 65 | Temp 97.9°F | Resp 12 | Ht 72.0 in | Wt 166.5 lb

## 2016-03-05 DIAGNOSIS — R634 Abnormal weight loss: Secondary | ICD-10-CM

## 2016-03-05 DIAGNOSIS — E559 Vitamin D deficiency, unspecified: Secondary | ICD-10-CM | POA: Diagnosis not present

## 2016-03-05 DIAGNOSIS — M791 Myalgia, unspecified site: Secondary | ICD-10-CM

## 2016-03-05 DIAGNOSIS — E785 Hyperlipidemia, unspecified: Secondary | ICD-10-CM | POA: Diagnosis not present

## 2016-03-05 DIAGNOSIS — R42 Dizziness and giddiness: Secondary | ICD-10-CM

## 2016-03-05 LAB — COMPREHENSIVE METABOLIC PANEL
ALK PHOS: 114 U/L (ref 39–117)
ALT: 17 U/L (ref 0–53)
AST: 19 U/L (ref 0–37)
Albumin: 4.3 g/dL (ref 3.5–5.2)
BILIRUBIN TOTAL: 0.9 mg/dL (ref 0.2–1.2)
BUN: 11 mg/dL (ref 6–23)
CO2: 30 mEq/L (ref 19–32)
CREATININE: 1.07 mg/dL (ref 0.40–1.50)
Calcium: 9.9 mg/dL (ref 8.4–10.5)
Chloride: 102 mEq/L (ref 96–112)
GFR: 72.46 mL/min (ref >60.00)
GLUCOSE: 95 mg/dL (ref 70–99)
Potassium: 4.5 mEq/L (ref 3.5–5.1)
SODIUM: 138 meq/L (ref 135–145)
TOTAL PROTEIN: 7.3 g/dL (ref 6.0–8.3)

## 2016-03-05 LAB — LIPID PANEL
Cholesterol: 249 mg/dL — ABNORMAL HIGH (ref 0–200)
HDL: 60.6 mg/dL (ref >39.00)
LDL Cholesterol: 174 mg/dL — ABNORMAL HIGH (ref 0–99)
NONHDL: 187.94
Total CHOL/HDL Ratio: 4
Triglycerides: 71 mg/dL (ref 0.0–149.0)
VLDL: 14.2 mg/dL (ref 0.0–40.0)

## 2016-03-05 LAB — VITAMIN D 25 HYDROXY (VIT D DEFICIENCY, FRACTURES): VITD: 28.66 ng/mL — AB (ref 30.00–100.00)

## 2016-03-05 LAB — CK: Total CK: 92 U/L (ref 7–232)

## 2016-03-05 NOTE — Progress Notes (Signed)
Subjective:  Patient ID: Richard Clayton, male    DOB: March 15, 1946  Age: 70 y.o. MRN: [id number]  CC: The primary encounter diagnosis was Hyperlipidemia. Diagnoses of Muscle pain, Vitamin D deficiency, Loss of weight, Dizziness and giddiness, and Hyperlipidemia with target LDL less than 160 were also pertinent to this visit.  HPI Richard Clayton presents for FOLLOW UP  ON hyperlipidemia and dizziness  NO LONGER TAKING RED YEAST RICE  DIZZINESS ATTRIBUTED TO BLOOD DONATION .  Has stopped donating .[name] No recent issues.   persisitent low back pain raiating to hips and lower legs,  Impriving with 2 changes:  Stopping Sleeping on righ side for the last 2 days with legs bent   no supplements or use of Vita D in 6 months l   Weight loss noted.  Wt has been stable Bowel moving regularly  last colonoscopy clean 2016  Follow up 2021    Outpatient Prescriptions Prior to Visit  Medication Sig Dispense Refill  . cholecalciferol (VITAMIN D) 400 UNITS TABS tablet Take 2,000 Units by mouth daily. Reported on 03/05/2016     No facility-administered medications prior to visit.    Review of Systems;  Patient denies headache, fevers, malaise, unintentional weight loss, skin rash, eye pain, sinus congestion and sinus pain, sore throat, dysphagia,  hemoptysis , cough, dyspnea, wheezing, chest pain, palpitations, orthopnea, edema, abdominal pain, nausea, melena, diarrhea, constipation, flank pain, dysuria, hematuria, urinary  Frequency, nocturia, numbness, tingling, seizures,  Focal weakness, Loss of consciousness,  Tremor, insomnia, depression, anxiety, and suicidal ideation.      Objective:  BP 106/70 mmHg  Pulse 65  Temp(Src) 97.9 F (36.6 C) (Oral)  Resp 12  Ht 6' (1.829 m)  Wt 166 lb 8 oz (75.524 kg)  BMI 22.58 kg/m2  SpO2 98%  BP Readings from Last 3 Encounters:  03/05/16 106/70  09/04/15 115/96  08/26/15 128/74    Wt Readings from Last 3 Encounters:  03/05/16 166 lb 8 oz (75.524  kg)  08/26/15 170 lb (77.111 kg)  08/21/15 170 lb 6 oz (77.282 kg)    General appearance: alert, cooperative and appears stated age Ears: normal TM's and external ear canals both ears Throat: lips, mucosa, and tongue normal; teeth and gums normal Neck: no adenopathy, no carotid bruit, supple, symmetrical, trachea midline and thyroid not enlarged, symmetric, no tenderness/mass/nodules Back: symmetric, no curvature. ROM normal. No CVA tenderness. Lungs: clear to auscultation bilaterally Heart: regular rate and rhythm, S1, S2 normal, no murmur, click, rub or gallop Abdomen: soft, non-tender; bowel sounds normal; no masses,  no organomegaly Pulses: 2+ and symmetric Skin: Skin color, texture, turgor normal. No rashes or lesions Lymph nodes: Cervical, supraclavicular, and axillary nodes normal.  No results found for: HGBA1C  Lab Results  Component Value Date   CREATININE 1.07 03/05/2016   CREATININE 0.94 08/21/2015   CREATININE 0.99 02/27/2015    Lab Results  Component Value Date   WBC 4.7 08/21/2015   HGB 15.2 08/21/2015   HCT 45.0 08/21/2015   PLT 190.0 08/21/2015   GLUCOSE 95 03/05/2016   CHOL 249* 03/05/2016   TRIG 71.0 03/05/2016   HDL 60.60 03/05/2016   LDLCALC 174* 03/05/2016   ALT 17 03/05/2016   AST 19 03/05/2016   NA 138 03/05/2016   K 4.5 03/05/2016   CL 102 03/05/2016   CREATININE 1.07 03/05/2016   BUN 11 03/05/2016   CO2 30 03/05/2016   TSH 2.040 03/05/2016   PSA 1.59 07/13/2014   MICROALBUR 0.2  12/13/2013    No results found.  Assessment & Plan:   Problem List Items Addressed This Visit    Hyperlipidemia with target LDL less than 160    Managed with diet due to statin intolerance      Dizziness and giddiness    Resolved, secondary to volume loss during blood donation.  No subsequent episodes       Muscle pain    Secondary to red yeast rice. Now resolved.      Relevant Orders   Comprehensive metabolic panel (Completed)   CK (Completed)    Vitamin D deficiency   Relevant Orders   VITAMIN D 25 Hydroxy (Vit-D Deficiency, Fractures) (Completed)    Other Visit Diagnoses    Hyperlipidemia    -  Primary    Relevant Orders    Lipid panel (Completed)    Loss of weight        Relevant Orders    T4 AND TSH (Completed)      A total of 25 minutes of face to face time was spent with patient more than half of which was spent in counselling about the above mentioned conditions  and coordination of care  I am having Mr. Butrick maintain his cholecalciferol.  No orders of the defined types were placed in this encounter.    There are no discontinued medications.  Follow-up: No Follow-up on file.   [name] Shams, MD

## 2016-03-05 NOTE — Patient Instructions (Signed)
I'm glad your leg and muscle pain has improved.  If it recurs,  We should consider imaging your lower spine to  Assess for degenerative disk disease.

## 2016-03-05 NOTE — Progress Notes (Signed)
Pre-visit discussion using our clinic review tool. No additional management support is needed unless otherwise documented below in the visit note.  

## 2016-03-06 LAB — T4 AND TSH
T4, Total: 9.2 ug/dL (ref 4.5–12.0)
TSH: 2.04 u[IU]/mL (ref 0.450–4.500)

## 2016-03-08 ENCOUNTER — Encounter: Payer: Self-pay | Admitting: Internal Medicine

## 2016-03-08 DIAGNOSIS — Z789 Other specified health status: Secondary | ICD-10-CM | POA: Insufficient documentation

## 2016-03-08 NOTE — Assessment & Plan Note (Signed)
Secondary to red yeast rice. Now resolved.

## 2016-03-08 NOTE — Assessment & Plan Note (Signed)
Managed with diet due to statin intolerance

## 2016-03-08 NOTE — Assessment & Plan Note (Signed)
Resolved, secondary to volume loss during blood donation.  No subsequent episodes

## 2016-05-26 DIAGNOSIS — H43811 Vitreous degeneration, right eye: Secondary | ICD-10-CM | POA: Diagnosis not present

## 2016-07-28 DIAGNOSIS — H43811 Vitreous degeneration, right eye: Secondary | ICD-10-CM | POA: Diagnosis not present

## 2016-09-30 ENCOUNTER — Ambulatory Visit (INDEPENDENT_AMBULATORY_CARE_PROVIDER_SITE_OTHER): Payer: Medicare PPO | Admitting: Internal Medicine

## 2016-09-30 ENCOUNTER — Encounter: Payer: Self-pay | Admitting: Internal Medicine

## 2016-09-30 VITALS — BP 128/86 | HR 60 | Temp 97.7°F | Resp 12 | Ht 72.0 in | Wt 164.0 lb

## 2016-09-30 DIAGNOSIS — E559 Vitamin D deficiency, unspecified: Secondary | ICD-10-CM

## 2016-09-30 DIAGNOSIS — E785 Hyperlipidemia, unspecified: Secondary | ICD-10-CM | POA: Diagnosis not present

## 2016-09-30 DIAGNOSIS — Z789 Other specified health status: Secondary | ICD-10-CM | POA: Diagnosis not present

## 2016-09-30 DIAGNOSIS — Z23 Encounter for immunization: Secondary | ICD-10-CM

## 2016-09-30 DIAGNOSIS — R42 Dizziness and giddiness: Secondary | ICD-10-CM

## 2016-09-30 LAB — VITAMIN D 25 HYDROXY (VIT D DEFICIENCY, FRACTURES): VITD: 29.8 ng/mL — AB (ref 30.00–100.00)

## 2016-09-30 LAB — LIPID PANEL
CHOLESTEROL: 226 mg/dL — AB (ref 0–200)
HDL: 59 mg/dL (ref >39.00)
LDL Cholesterol: 154 mg/dL — ABNORMAL HIGH (ref 0–99)
NONHDL: 167.13
Total CHOL/HDL Ratio: 4
Triglycerides: 67 mg/dL (ref 0.0–149.0)
VLDL: 13.4 mg/dL (ref 0.0–40.0)

## 2016-09-30 NOTE — Progress Notes (Signed)
Subjective:  Patient ID: Richard Clayton, male    DOB: 05/13/1946  Age: 70 y.o. MRN: [id number]  CC: The primary encounter diagnosis was Vitamin D deficiency. Diagnoses of Hyperlipidemia with target LDL less than 160, Dizziness and giddiness, Encounter for immunization, and Statin intolerance were also pertinent to this visit.  HPI Richard Clayton presents for follow up hyperlipidemia managed with diet due to recurrent myalgias causing decreased exercise tolerance caused by statin use  Has been off of statin therapy for 2 years,  And for 1.5 years off of Red Yeast rice.  Exercising better,  Still inconsistent due to social obligations.  Up to 45 minutes on treadmill , spent 8 hours on feet yesterday with good tolerance  Right leg hamstring tightness has improved significantly since giving up statins and RYR ,  Excessive consumption of Red wine also causing pain .      Weight loss: He continues to report decreased appetite and follows a very healthy diet.  Oatmeal and wheat germ for breakfast, dinner is pizza , pasta. Chicken, or scrambled eggs (20 2/week or rarely a steak.  Skips lunch .  Drinks soymilk before bedtime.    Outpatient Medications Prior to Visit  Medication Sig Dispense Refill  . cholecalciferol (VITAMIN D) 400 UNITS TABS tablet Take 2,000 Units by mouth daily. Reported on 03/05/2016     No facility-administered medications prior to visit.     Review of Systems;  Patient denies headache, fevers, malaise, unintentional weight loss, skin rash, eye pain, sinus congestion and sinus pain, sore throat, dysphagia,  hemoptysis , cough, dyspnea, wheezing, chest pain, palpitations, orthopnea, edema, abdominal pain, nausea, melena, diarrhea, constipation, flank pain, dysuria, hematuria, urinary  Frequency, nocturia, numbness, tingling, seizures,  Focal weakness, Loss of consciousness,  Tremor, insomnia, depression, anxiety, and suicidal ideation.      Objective:  BP 128/86   Pulse 60    Temp 97.7 F (36.5 C) (Oral)   Resp 12   Ht 6' (1.829 m)   Wt 164 lb (74.4 kg)   SpO2 98%   BMI 22.24 kg/m   BP Readings from Last 3 Encounters:  09/30/16 128/86  03/05/16 106/70  09/04/15 (!) 115/96    Wt Readings from Last 3 Encounters:  09/30/16 164 lb (74.4 kg)  03/05/16 166 lb 8 oz (75.5 kg)  08/26/15 170 lb (77.1 kg)    General appearance: alert, cooperative and appears stated age Ears: normal TM's and external ear canals both ears Throat: lips, mucosa, and tongue normal; teeth and gums normal Neck: no adenopathy, no carotid bruit, supple, symmetrical, trachea midline and thyroid not enlarged, symmetric, no tenderness/mass/nodules Back: symmetric, no curvature. ROM normal. No CVA tenderness. Lungs: clear to auscultation bilaterally Heart: regular rate and rhythm, S1, S2 normal, no murmur, click, rub or gallop Abdomen: soft, non-tender; bowel sounds normal; no masses,  no organomegaly Pulses: 2+ and symmetric Skin: Skin color, texture, turgor normal. No rashes or lesions Lymph nodes: Cervical, supraclavicular, and axillary nodes normal.  No results found for: HGBA1C  Lab Results  Component Value Date   CREATININE 1.07 03/05/2016   CREATININE 0.94 08/21/2015   CREATININE 0.99 02/27/2015    Lab Results  Component Value Date   WBC 4.7 08/21/2015   HGB 15.2 08/21/2015   HCT 45.0 08/21/2015   PLT 190.0 08/21/2015   GLUCOSE 95 03/05/2016   CHOL 226 (H) 09/30/2016   TRIG 67.0 09/30/2016   HDL 59.00 09/30/2016   LDLCALC 154 (H) 09/30/2016   ALT  17 03/05/2016   AST 19 03/05/2016   NA 138 03/05/2016   K 4.5 03/05/2016   CL 102 03/05/2016   CREATININE 1.07 03/05/2016   BUN 11 03/05/2016   CO2 30 03/05/2016   TSH 2.040 03/05/2016   PSA 1.59 07/13/2014   MICROALBUR 0.2 12/13/2013    No results found.  Assessment & Plan:   Problem List Items Addressed This Visit    Hyperlipidemia with target LDL less than 160    Managed with diet alone.  10  Yr risk  of CAD is 22%  (using the FRC) .  alternative low cholesterol diet recommended .  Lab Results  Component Value Date   CHOL 226 (H) 09/30/2016   HDL 59.00 09/30/2016   LDLCALC 154 (H) 09/30/2016   TRIG 67.0 09/30/2016   CHOLHDL 4 09/30/2016         Relevant Orders   Lipid panel (Completed)   RESOLVED: Dizziness and giddiness    Resolved since giving blood.       Vitamin D deficiency - Primary    Very mild,  Continue 2000 IUs daily of D3      Relevant Orders   VITAMIN D 25 Hydroxy (Vit-D Deficiency, Fractures) (Completed)   Statin intolerance    He had persistent muscle pain Secondary to statins and red yeast rice. Now resolved.       Other Visit Diagnoses    Encounter for immunization       Relevant Orders   Flu vaccine HIGH DOSE PF (Completed)     A total of 40 minutes of face to face time was spent with patient more than half of which was spent in counselling and coordination of care   I have discontinued Mr. Colee's cholecalciferol. I am also having him maintain his vitamin C, Vitamin D3, thiamine, vitamin E, Lecithin, and Misc Natural Products (GLUCOSAMINE CHOND MSM FORMULA PO).  Meds ordered this encounter  Medications  . vitamin C (ASCORBIC ACID) 500 MG tablet    Sig: Take 500 mg by mouth 2 (two) times daily.  . Cholecalciferol (VITAMIN D3) 2000 units TABS    Sig: Take 1 tablet by mouth daily.  [name] thiamine (VITAMIN B-1) 100 MG tablet    Sig: Take 100 mg by mouth daily.  . vitamin E 1000 UNIT capsule    Sig: Take 1,000 Units by mouth 2 (two) times daily.  . Lecithin 1200 MG CAPS    Sig: Take 2 capsules by mouth daily.  . Misc Natural Products (GLUCOSAMINE CHOND MSM FORMULA PO)    Sig: Take 1 tablet by mouth daily.    Medications Discontinued During This Encounter  Medication Reason  . cholecalciferol (VITAMIN D) 400 UNITS TABS tablet Error    Follow-up: Return in about 6 months (around 03/30/2017), or wellness /CPe.   [name] Shams, MD

## 2016-09-30 NOTE — Assessment & Plan Note (Addendum)
Managed with diet alone.  10  Yr risk of CAD is 22%  (using the FRC) .  alternative low cholesterol diet recommended .  Lab Results  Component Value Date   CHOL 226 (H) 09/30/2016   HDL 59.00 09/30/2016   LDLCALC 154 (H) 09/30/2016   TRIG 67.0 09/30/2016   CHOLHDL 4 09/30/2016

## 2016-09-30 NOTE — Progress Notes (Signed)
Pre-visit discussion using our clinic review tool. No additional management support is needed unless otherwise documented below in the visit note.  

## 2016-09-30 NOTE — Patient Instructions (Addendum)
.  Glad to hear the muscle aches are resolving  If your repeat lipid panel is elevated,  Your choice is to retry Red Yeast Rice at a lower dose,  Or try the diet below  .   The  diet I discussed with you today is the 10 day Green Smoothie Cleansing /Detox Diet by Brooke Dare . available at [employer] or on Amazon for around $10.  This is a very  low fat diet that eliminates sugar, gluten, caffeine, alcohol and dairy products  for 10 days .  What you add back after the initial ten days is entirely up to  you!     I suggest  Substituting 2 meals daily and keeping one chewable meal (but keep it simple, like baked fish/lamb [place] and salad, rice or bok choy) kept me satisfied and kept me from straying  .  You snack primarily on fresh  fruit, egg whites and judicious quantities of nuts. Cathie Hoops can add a  vegetable based protein powder  To any smoothie made with almond milk.  (do not use protein powders made from whey , since whey is dairy) .  [employer] has a [employer], [employer] .   Your will need some form of a nutrient extractor (Vita Mix, an electric juicer,  Or A Ninja  Nutribullet Rx).  Using frozen fruits is much more convenient and cost effective. Use organic fruit when possible.  Just thaw by steeping your fruit in  hot water for a few minutes  (to avoid jamming up your machine)   Brooke Dare also has a 30 day smoothie diet that has lots of wonderful recipes for "Non smoothie" meals.    For prevention of colds from viral infections:  You should try using  [employer]'s Sinus rinse ;  It is a stong sinus "flush" using water and medicated salts.  Do it once daily when you come hoe from being in the public (I suggest you do it over the sink because it can be a bit messy)

## 2016-09-30 NOTE — Assessment & Plan Note (Signed)
Resolved since giving blood.

## 2016-10-03 NOTE — Assessment & Plan Note (Signed)
Very mild,  Continue 2000 IUs daily of D3

## 2016-10-03 NOTE — Assessment & Plan Note (Addendum)
He had persistent muscle pain Secondary to statins and red yeast rice. Now resolved.

## 2017-01-14 ENCOUNTER — Ambulatory Visit: Payer: Self-pay

## 2017-01-22 ENCOUNTER — Telehealth: Payer: Self-pay | Admitting: Internal Medicine

## 2017-01-22 NOTE — Telephone Encounter (Signed)
Pt declined to get the AWV. Thank you! °

## 2017-03-31 ENCOUNTER — Ambulatory Visit (INDEPENDENT_AMBULATORY_CARE_PROVIDER_SITE_OTHER): Payer: Medicare PPO | Admitting: Internal Medicine

## 2017-03-31 ENCOUNTER — Encounter: Payer: Self-pay | Admitting: Internal Medicine

## 2017-03-31 VITALS — BP 97/74 | HR 66 | Temp 98.0°F | Resp 17 | Ht 72.0 in | Wt 171.4 lb

## 2017-03-31 DIAGNOSIS — R5383 Other fatigue: Secondary | ICD-10-CM

## 2017-03-31 DIAGNOSIS — Z789 Other specified health status: Secondary | ICD-10-CM

## 2017-03-31 DIAGNOSIS — Z0000 Encounter for general adult medical examination without abnormal findings: Secondary | ICD-10-CM | POA: Diagnosis not present

## 2017-03-31 DIAGNOSIS — E559 Vitamin D deficiency, unspecified: Secondary | ICD-10-CM

## 2017-03-31 DIAGNOSIS — E785 Hyperlipidemia, unspecified: Secondary | ICD-10-CM | POA: Diagnosis not present

## 2017-03-31 DIAGNOSIS — Z125 Encounter for screening for malignant neoplasm of prostate: Secondary | ICD-10-CM | POA: Diagnosis not present

## 2017-03-31 LAB — CBC WITH DIFFERENTIAL/PLATELET
Basophils Absolute: 0.1 10*3/uL (ref 0.0–0.1)
Basophils Relative: 2 % (ref 0.0–3.0)
EOS PCT: 3 % (ref 0.0–5.0)
Eosinophils Absolute: 0.1 10*3/uL (ref 0.0–0.7)
HEMATOCRIT: 44.3 % (ref 39.0–52.0)
HEMOGLOBIN: 15 g/dL (ref 13.0–17.0)
LYMPHS PCT: 22.6 % (ref 12.0–46.0)
Lymphs Abs: 1.1 10*3/uL (ref 0.7–4.0)
MCHC: 33.8 g/dL (ref 30.0–36.0)
MCV: 89 fl (ref 78.0–100.0)
MONOS PCT: 9.9 % (ref 3.0–12.0)
Monocytes Absolute: 0.5 10*3/uL (ref 0.1–1.0)
Neutro Abs: 3 10*3/uL (ref 1.4–7.7)
Neutrophils Relative %: 62.5 % (ref 43.0–77.0)
Platelets: 214 10*3/uL (ref 150.0–400.0)
RBC: 4.98 Mil/uL (ref 4.22–5.81)
RDW: 13.1 % (ref 11.5–15.5)
WBC: 4.8 10*3/uL (ref 4.0–10.5)

## 2017-03-31 LAB — COMPREHENSIVE METABOLIC PANEL
ALT: 18 U/L (ref 0–53)
AST: 20 U/L (ref 0–37)
Albumin: 4 g/dL (ref 3.5–5.2)
Alkaline Phosphatase: 97 U/L (ref 39–117)
BUN: 12 mg/dL (ref 6–23)
CALCIUM: 9.5 mg/dL (ref 8.4–10.5)
CHLORIDE: 103 meq/L (ref 96–112)
CO2: 30 meq/L (ref 19–32)
CREATININE: 0.96 mg/dL (ref 0.40–1.50)
GFR: 81.88 mL/min (ref >60.00)
Glucose, Bld: 103 mg/dL — ABNORMAL HIGH (ref 70–99)
POTASSIUM: 4.4 meq/L (ref 3.5–5.1)
Sodium: 138 mEq/L (ref 135–145)
TOTAL PROTEIN: 6.7 g/dL (ref 6.0–8.3)
Total Bilirubin: 0.7 mg/dL (ref 0.2–1.2)

## 2017-03-31 LAB — LIPID PANEL
CHOL/HDL RATIO: 4
Cholesterol: 236 mg/dL — ABNORMAL HIGH (ref 0–200)
HDL: 61 mg/dL (ref >39.00)
LDL Cholesterol: 162 mg/dL — ABNORMAL HIGH (ref 0–99)
NonHDL: 175.28
Triglycerides: 68 mg/dL (ref 0.0–149.0)
VLDL: 13.6 mg/dL (ref 0.0–40.0)

## 2017-03-31 LAB — VITAMIN D 25 HYDROXY (VIT D DEFICIENCY, FRACTURES): VITD: 54.37 ng/mL (ref 30.00–100.00)

## 2017-03-31 LAB — TSH: TSH: 1.88 u[IU]/mL (ref 0.35–4.50)

## 2017-03-31 NOTE — Patient Instructions (Signed)
 Health Maintenance, Male A healthy lifestyle and preventive care is important for your health and wellness. Ask your health care provider about what schedule of regular examinations is right for you. What should I know about weight and diet?  Eat a Healthy Diet  Eat plenty of vegetables, fruits, whole grains, low-fat dairy products, and lean protein.  Do not eat a lot of foods high in solid fats, added sugars, or salt. Maintain a Healthy Weight  Regular exercise can help you achieve or maintain a healthy weight. You should:  Do at least 150 minutes of exercise each week. The exercise should increase your heart rate and make you sweat (moderate-intensity exercise).  Do strength-training exercises at least twice a week. Watch Your Levels of Cholesterol and Blood Lipids  Have your blood tested for lipids and cholesterol every 5 years starting at 72 years of age. If you are at high risk for heart disease, you should start having your blood tested when you are 72 years old. You may need to have your cholesterol levels checked more often if:  Your lipid or cholesterol levels are high.  You are older than 72 years of age.  You are at high risk for heart disease. What should I know about cancer screening? Many types of cancers can be detected early and may often be prevented. Lung Cancer  You should be screened every year for lung cancer if:  You are a current smoker who has smoked for at least 30 years.  You are a former smoker who has quit within the past 15 years.  Talk to your health care provider about your screening options, when you should start screening, and how often you should be screened. Colorectal Cancer  Routine colorectal cancer screening usually begins at 72 years of age and should be repeated every 5-10 years until you are 72 years old. You may need to be screened more often if early forms of precancerous polyps or small growths are found. Your health care provider  may recommend screening at an earlier age if you have risk factors for colon cancer.  Your health care provider may recommend using home test kits to check for hidden blood in the stool.  A small camera at the end of a tube can be used to examine your colon (sigmoidoscopy or colonoscopy). This checks for the earliest forms of colorectal cancer. Prostate and Testicular Cancer  Depending on your age and overall health, your health care provider may do certain tests to screen for prostate and testicular cancer.  Talk to your health care provider about any symptoms or concerns you have about testicular or prostate cancer. Skin Cancer  Check your skin from head to toe regularly.  Tell your health care provider about any new moles or changes in moles, especially if:  There is a change in a mole's size, shape, or color.  You have a mole that is larger than a pencil eraser.  Always use sunscreen. Apply sunscreen liberally and repeat throughout the day.  Protect yourself by wearing long sleeves, pants, a wide-brimmed hat, and sunglasses when outside. What should I know about heart disease, diabetes, and high blood pressure?  If you are 18-39 years of age, have your blood pressure checked every 3-5 years. If you are 40 years of age or older, have your blood pressure checked every year. You should have your blood pressure measured twice-once when you are at a hospital or clinic, and once when you are not at   a hospital or clinic. Record the average of the two measurements. To check your blood pressure when you are not at a hospital or clinic, you can use:  An automated blood pressure machine at a pharmacy.  A home blood pressure monitor.  Talk to your health care provider about your target blood pressure.  If you are between 45-79 years old, ask your health care provider if you should take aspirin to prevent heart disease.  Have regular diabetes screenings by checking your fasting blood sugar  level.  If you are at a normal weight and have a low risk for diabetes, have this test once every three years after the age of 45.  If you are overweight and have a high risk for diabetes, consider being tested at a younger age or more often.  A one-time screening for abdominal aortic aneurysm (AAA) by ultrasound is recommended for men aged 65-75 years who are current or former smokers. What should I know about preventing infection? Hepatitis B  If you have a higher risk for hepatitis B, you should be screened for this virus. Talk with your health care provider to find out if you are at risk for hepatitis B infection. Hepatitis C  Blood testing is recommended for:  Everyone born from 1945 through 1965.  Anyone with known risk factors for hepatitis C. Sexually Transmitted Diseases (STDs)  You should be screened each year for STDs including gonorrhea and chlamydia if:  You are sexually active and are younger than 72 years of age.  You are older than 72 years of age and your health care provider tells you that you are at risk for this type of infection.  Your sexual activity has changed since you were last screened and you are at an increased risk for chlamydia or gonorrhea. Ask your health care provider if you are at risk.  Talk with your health care provider about whether you are at high risk of being infected with HIV. Your health care provider may recommend a prescription medicine to help prevent HIV infection. What else can I do?  Schedule regular health, dental, and eye exams.  Stay current with your vaccines (immunizations).  Do not use any tobacco products, such as cigarettes, chewing tobacco, and e-cigarettes. If you need help quitting, ask your health care provider.  Limit alcohol intake to no more than 2 drinks per day. One drink equals 12 ounces of beer, 5 ounces of wine, or 1 ounces of hard liquor.  Do not use street drugs.  Do not share needles.  Ask your health  care provider for help if you need support or information about quitting drugs.  Tell your health care provider if you often feel depressed.  Tell your health care provider if you have ever been abused or do not feel safe at home. This information is not intended to replace advice given to you by your health care provider. Make sure you discuss any questions you have with your health care provider. Document Released: 05/14/2008 Document Revised: 07/15/2016 Document Reviewed: 08/20/2015 Elsevier Interactive Patient Education  2017 Elsevier Inc.  

## 2017-03-31 NOTE — Progress Notes (Signed)
Subjective:  Patient ID: Richard Clayton, male    DOB: [birth year]  Age: 71 y.o. MRN: [id number]  CC: The primary encounter diagnosis was Fatigue, unspecified type. Diagnoses of Hyperlipidemia with target LDL less than 160, Vitamin D deficiency, Prostate cancer screening, and Statin intolerance were also pertinent to this visit.  HPI Richard Clayton presents for annual  CPE and FOLLOW UP ON CHRONIC ISSUES INCLUDING hyperlipidemia with statin intolerance,  Unintentional weight loss. Has resolved.  Lab Results  Component Value Date   PSA 1.59 07/13/2014    Unintentional weight loss:  diet discussed.    Has added premier protein after his workouts.   Had a viral illness mild in January , no fevers.   No sinus or allergy issues thus far.   Neck pain resolved with use of neck support pillow   During the course of the visit , End of Life objectives were discussed at length,  Patient does not have a living will in place or a healthcare power of attorney. he was given printed information about advance directives and encouraged to return after discussing with his niece/nephew.   Last CPE was august 2016 Colonoscopy 2016  Physically active: spends 45 min on treadmill daily. Back to full routine stopped lipitor 2 years ago,  Muscle pain,   Not tolerating red yeast rice only once two times per week Reduced wine from 1 bottle to 2 glasses  Most days.  . Cat wakes him up at 4:00 am every morning   No bowel issues , up to date on screening   Increased vitamin D to 10,000 Ius daily  For the past 3 months  Sleeps 5-6 hours,  Has a daytime nap -1-2  Hours.       Outpatient Medications Prior to Visit  Medication Sig Dispense Refill  . Cholecalciferol (VITAMIN D3) 2000 units TABS Take 1 tablet by mouth daily.    . Lecithin 1200 MG CAPS Take 2 capsules by mouth daily.    . Misc Natural Products (GLUCOSAMINE CHOND MSM FORMULA PO) Take 1 tablet by mouth daily.    [name] thiamine (VITAMIN B-1) 100 MG tablet  Take 100 mg by mouth 2 (two) times daily.     . vitamin C (ASCORBIC ACID) 500 MG tablet Take 500 mg by mouth 2 (two) times daily.    . vitamin E 1000 UNIT capsule Take 1,000 Units by mouth 2 (two) times daily.     No facility-administered medications prior to visit.     Review of Systems;  Patient denies headache, fevers, malaise, unintentional weight loss, skin rash, eye pain, sinus congestion and sinus pain, sore throat, dysphagia,  hemoptysis , cough, dyspnea, wheezing, chest pain, palpitations, orthopnea, edema, abdominal pain, nausea, melena, diarrhea, constipation, flank pain, dysuria, hematuria, urinary  Frequency, nocturia, numbness, tingling, seizures,  Focal weakness, Loss of consciousness,  Tremor, insomnia, depression, anxiety, and suicidal ideation.      Objective:  BP 97/74 (BP Location: Left Arm, Patient Position: Sitting, Cuff Size: Normal)   Pulse 66   Temp 98 F (36.7 C) (Oral)   Resp 17   Ht 6' (1.829 m)   Wt 171 lb 6.4 oz (77.7 kg)   SpO2 97%   BMI 23.25 kg/m   BP Readings from Last 3 Encounters:  03/31/17 97/74  09/30/16 128/86  03/05/16 106/70    Wt Readings from Last 3 Encounters:  03/31/17 171 lb 6.4 oz (77.7 kg)  09/30/16 164 lb (74.4 kg)  03/05/16 166 lb 8  oz (75.5 kg)    General appearance: alert, cooperative and appears stated age Ears: normal TM's and external ear canals both ears Throat: lips, mucosa, and tongue normal; teeth and gums normal Neck: no adenopathy, no carotid bruit, supple, symmetrical, trachea midline and thyroid not enlarged, symmetric, no tenderness/mass/nodules Back: symmetric, no curvature. ROM normal. No CVA tenderness. Lungs: clear to auscultation bilaterally Heart: regular rate and rhythm, S1, S2 normal, no murmur, click, rub or gallop Abdomen: soft, non-tender; bowel sounds normal; no masses,  no organomegaly Pulses: 2+ and symmetric Skin: Skin color, texture, turgor normal. No rashes or lesions Lymph nodes: Cervical,  supraclavicular, and axillary nodes normal.  No results found for: HGBA1C  Lab Results  Component Value Date   CREATININE 0.96 03/31/2017   CREATININE 1.07 03/05/2016   CREATININE 0.94 08/21/2015    Lab Results  Component Value Date   WBC 4.8 03/31/2017   HGB 15.0 03/31/2017   HCT 44.3 03/31/2017   PLT 214.0 03/31/2017   GLUCOSE 103 (H) 03/31/2017   CHOL 236 (H) 03/31/2017   TRIG 68.0 03/31/2017   HDL 61.00 03/31/2017   LDLCALC 162 (H) 03/31/2017   ALT 18 03/31/2017   AST 20 03/31/2017   NA 138 03/31/2017   K 4.4 03/31/2017   CL 103 03/31/2017   CREATININE 0.96 03/31/2017   BUN 12 03/31/2017   CO2 30 03/31/2017   TSH 1.88 03/31/2017   PSA 1.59 07/13/2014   MICROALBUR 0.2 12/13/2013    No results found.  Assessment & Plan:   Problem List Items Addressed This Visit    Encounter for preventive health examination    Annual comprehensive preventive exam was done as well as an evaluation and management of acute and chronic conditions .  During the course of the visit the patient was educated and counseled about appropriate screening and preventive services including :  diabetes screening, lipid analysis with projected  10 year  risk for CAD , nutrition counseling, prostate and colorectal cancer screening, and recommended immunizations. patiet continues to defere screenings.  Printed recommendations for health maintenance screenings was given.   No screening requested per patient preference.       Hyperlipidemia with target LDL less than 160    Managed with diet alone.  10  Yr risk of CAD is 22%  (using the FRC) .  alternative low cholesterol diet recommended .  Lab Results  Component Value Date   CHOL 236 (H) 03/31/2017   HDL 61.00 03/31/2017   LDLCALC 162 (H) 03/31/2017   TRIG 68.0 03/31/2017   CHOLHDL 4 03/31/2017         Relevant Orders   Lipid panel (Completed)   Statin intolerance    Advised to continue red yeast rice or consider Zetia for primary stroke  prevention   Lab Results  Component Value Date   CHOL 236 (H) 03/31/2017   HDL 61.00 03/31/2017   LDLCALC 162 (H) 03/31/2017   TRIG 68.0 03/31/2017   CHOLHDL 4 03/31/2017         Vitamin D deficiency   Relevant Orders   VITAMIN D 25 Hydroxy (Vit-D Deficiency, Fractures) (Completed)    Other Visit Diagnoses    Fatigue, unspecified type    -  Primary   Relevant Orders   Comprehensive metabolic panel (Completed)   CBC with Differential/Platelet (Completed)   TSH (Completed)      I am having Mr. Coby maintain his vitamin C, Vitamin D3, thiamine, vitamin E, Lecithin, Misc Natural Products (  GLUCOSAMINE CHOND MSM FORMULA PO), and Red Yeast Rice.  Meds ordered this encounter  Medications  . Red Yeast Rice 600 MG CAPS    Sig: Take 600 mg by mouth 2 (two) times a week.    There are no discontinued medications.  Follow-up: No Follow-up on file.   [name] Shams, MD

## 2017-03-31 NOTE — Progress Notes (Signed)
Pre visit review using our clinic review tool, if applicable. No additional management support is needed unless otherwise documented below in the visit note. 

## 2017-03-31 NOTE — Assessment & Plan Note (Addendum)
Annual comprehensive preventive exam was done as well as an evaluation and management of acute and chronic conditions .  During the course of the visit the patient was educated and counseled about appropriate screening and preventive services including :  diabetes screening, lipid analysis with projected  10 year  risk for CAD , nutrition counseling, prostate and colorectal cancer screening, and recommended immunizations. patiet continues to defere screenings.  Printed recommendations for health maintenance screenings was given.   No screening requested per patient preference.

## 2017-04-02 ENCOUNTER — Encounter: Payer: Self-pay | Admitting: Internal Medicine

## 2017-04-03 NOTE — Assessment & Plan Note (Signed)
Advised to continue red yeast rice or consider Zetia for primary stroke prevention   Lab Results  Component Value Date   CHOL 236 (H) 03/31/2017   HDL 61.00 03/31/2017   LDLCALC 162 (H) 03/31/2017   TRIG 68.0 03/31/2017   CHOLHDL 4 03/31/2017

## 2017-04-03 NOTE — Assessment & Plan Note (Signed)
Managed with diet alone.  10  Yr risk of CAD is 22%  (using the FRC) .  alternative low cholesterol diet recommended .  Lab Results  Component Value Date   CHOL 236 (H) 03/31/2017   HDL 61.00 03/31/2017   LDLCALC 162 (H) 03/31/2017   TRIG 68.0 03/31/2017   CHOLHDL 4 03/31/2017

## 2017-07-29 DIAGNOSIS — Z961 Presence of intraocular lens: Secondary | ICD-10-CM | POA: Diagnosis not present

## 2017-10-04 ENCOUNTER — Ambulatory Visit: Payer: Medicare PPO | Admitting: Internal Medicine

## 2017-10-04 VITALS — BP 112/72 | HR 60 | Temp 97.7°F | Resp 15 | Ht 70.0 in | Wt 172.4 lb

## 2017-10-04 DIAGNOSIS — E78019 Familial hypercholesterolemia, unspecified: Secondary | ICD-10-CM

## 2017-10-04 DIAGNOSIS — E7801 Familial hypercholesterolemia: Secondary | ICD-10-CM

## 2017-10-04 DIAGNOSIS — M791 Myalgia, unspecified site: Secondary | ICD-10-CM

## 2017-10-04 DIAGNOSIS — E785 Hyperlipidemia, unspecified: Secondary | ICD-10-CM

## 2017-10-04 DIAGNOSIS — Z23 Encounter for immunization: Secondary | ICD-10-CM | POA: Diagnosis not present

## 2017-10-04 DIAGNOSIS — E559 Vitamin D deficiency, unspecified: Secondary | ICD-10-CM

## 2017-10-04 LAB — LIPID PANEL
CHOL/HDL RATIO: 4
Cholesterol: 249 mg/dL — ABNORMAL HIGH (ref 0–200)
HDL: 56.3 mg/dL (ref >39.00)
LDL Cholesterol: 181 mg/dL — ABNORMAL HIGH (ref 0–99)
NONHDL: 192.99
TRIGLYCERIDES: 59 mg/dL (ref 0.0–149.0)
VLDL: 11.8 mg/dL (ref 0.0–40.0)

## 2017-10-04 LAB — COMPREHENSIVE METABOLIC PANEL
ALK PHOS: 100 U/L (ref 39–117)
ALT: 14 U/L (ref 0–53)
AST: 18 U/L (ref 0–37)
Albumin: 4.2 g/dL (ref 3.5–5.2)
BILIRUBIN TOTAL: 1 mg/dL (ref 0.2–1.2)
BUN: 13 mg/dL (ref 6–23)
CALCIUM: 9.8 mg/dL (ref 8.4–10.5)
CO2: 28 mEq/L (ref 19–32)
Chloride: 102 mEq/L (ref 96–112)
Creatinine, Ser: 1 mg/dL (ref 0.40–1.50)
GFR: 78 mL/min (ref >60.00)
GLUCOSE: 87 mg/dL (ref 70–99)
POTASSIUM: 4.3 meq/L (ref 3.5–5.1)
Sodium: 137 mEq/L (ref 135–145)
TOTAL PROTEIN: 7.1 g/dL (ref 6.0–8.3)

## 2017-10-04 LAB — TSH: TSH: 2.09 u[IU]/mL (ref 0.35–4.50)

## 2017-10-04 LAB — VITAMIN D 25 HYDROXY (VIT D DEFICIENCY, FRACTURES): VITD: 75.45 ng/mL (ref 30.00–100.00)

## 2017-10-04 LAB — MAGNESIUM: Magnesium: 2.2 mg/dL (ref 1.5–2.5)

## 2017-10-04 MED ORDER — ZOSTER VAC RECOMB ADJUVANTED 50 MCG/0.5ML IM SUSR: 0.5000 mL | Freq: Once | INTRAMUSCULAR | 1 refills | Status: AC

## 2017-10-04 NOTE — Patient Instructions (Addendum)
The ShingRx vaccine is now available in local pharmacies and is much more protective than the previous vaccine called Zostavaxs,  It is therefore ADVISED for all interested adults over 50 to prevent shingles .   Checking lipids, thyroid and mg and D today

## 2017-10-04 NOTE — Progress Notes (Signed)
Subjective:  Patient ID: Richard Clayton, male    DOB: October 26, 1946  Age: 71 y.o. MRN: [id number]  CC: The primary encounter diagnosis was Familial hypercholesterolemia. Diagnoses of Muscle ache, Vitamin D deficiency, Encounter for immunization, and Hyperlipidemia with target LDL less than 160 were also pertinent to this visit.  HPI Richard Clayton presents for follow up on hyperlipidemia with statin intolerance , managed with RYR until several weeks ago, when he stopped due to  recurrent muscle cramps.  He has fasted today .   Discussed seeing cardiology for CT vs  He has stopped walking on the treadmill test bc he has developed posterior upper calf pain that is now lasting 2 DAYS.  Doing upper body only.   Suggesting adding elliptic glide or bike   He has  added 1/2 cup almonds with  2 glasses of wine daily to his diet.  .  Weight stable   Taking 10,000 IUs D3 daily,  No history of deficiency    Outpatient Medications Prior to Visit  Medication Sig Dispense Refill  . Cholecalciferol (VITAMIN D3) 2000 units TABS Take 1 tablet by mouth daily.    . Lecithin 1200 MG CAPS Take 2 capsules by mouth daily.    . Misc Natural Products (GLUCOSAMINE CHOND MSM FORMULA PO) Take 1 tablet by mouth daily.    [name] thiamine (VITAMIN B-1) 100 MG tablet Take 100 mg by mouth 2 (two) times daily.     . vitamin C (ASCORBIC ACID) 500 MG tablet Take 500 mg by mouth 2 (two) times daily.    . vitamin E 1000 UNIT capsule Take 1,000 Units by mouth 2 (two) times daily.    . Red Yeast Rice 600 MG CAPS Take 600 mg by mouth 2 (two) times a week.     No facility-administered medications prior to visit.     Review of Systems;  Patient denies headache, fevers, malaise, unintentional weight loss, skin rash, eye pain, sinus congestion and sinus pain, sore throat, dysphagia,  hemoptysis , cough, dyspnea, wheezing, chest pain, palpitations, orthopnea, edema, abdominal pain, nausea, melena, diarrhea, constipation, flank  pain, dysuria, hematuria, urinary  Frequency, nocturia, numbness, tingling, seizures,  Focal weakness, Loss of consciousness,  Tremor, insomnia, depression, anxiety, and suicidal ideation.      Objective:  BP 112/72 (BP Location: Left Arm, Patient Position: Sitting, Cuff Size: Normal)   Pulse 60   Temp 97.7 F (36.5 C) (Oral)   Resp 15   Ht 5\' 10"  (1.778 m)   Wt 172 lb 6.4 oz (78.2 kg)   SpO2 95%   BMI 24.74 kg/m   BP Readings from Last 3 Encounters:  10/04/17 112/72  03/31/17 97/74  09/30/16 128/86    Wt Readings from Last 3 Encounters:  10/04/17 172 lb 6.4 oz (78.2 kg)  03/31/17 171 lb 6.4 oz (77.7 kg)  09/30/16 164 lb (74.4 kg)    General appearance: alert, cooperative and appears stated age Ears: normal TM's and external ear canals both ears Throat: lips, mucosa, and tongue normal; teeth and gums normal Neck: no adenopathy, no carotid bruit, supple, symmetrical, trachea midline and thyroid not enlarged, symmetric, no tenderness/mass/nodules Back: symmetric, no curvature. ROM normal. No CVA tenderness. Lungs: clear to auscultation bilaterally Heart: regular rate and rhythm, S1, S2 normal, no murmur, click, rub or gallop Abdomen: soft, non-tender; bowel sounds normal; no masses,  no organomegaly Pulses: faint,  Symmetric, cap refill sluggish  Skin: Skin color, texture, turgor normal. No rashes or lesions Lymph nodes:  Cervical, supraclavicular, and axillary nodes normal.  No results found for: HGBA1C  Lab Results  Component Value Date   CREATININE 1.00 10/04/2017   CREATININE 0.96 03/31/2017   CREATININE 1.07 03/05/2016    Lab Results  Component Value Date   WBC 4.8 03/31/2017   HGB 15.0 03/31/2017   HCT 44.3 03/31/2017   PLT 214.0 03/31/2017   GLUCOSE 87 10/04/2017   CHOL 249 (H) 10/04/2017   TRIG 59.0 10/04/2017   HDL 56.30 10/04/2017   LDLCALC 181 (H) 10/04/2017   ALT 14 10/04/2017   AST 18 10/04/2017   NA 137 10/04/2017   K 4.3 10/04/2017   CL 102  10/04/2017   CREATININE 1.00 10/04/2017   BUN 13 10/04/2017   CO2 28 10/04/2017   TSH 2.09 10/04/2017   PSA 1.59 07/13/2014   MICROALBUR 0.2 12/13/2013    No results found.  Assessment & Plan:   Problem List Items Addressed This Visit    Hyperlipidemia with target LDL less than 160    Managed with diet alone due to intolerance of statins and red yeast rice. [name].LDL has risen to 181 without RYR , and his 10  Yr risk of CAD is 20%  (using the FRC) . Referral to cardiology advised  Lab Results  Component Value Date   CHOL 249 (H) 10/04/2017   HDL 56.30 10/04/2017   LDLCALC 181 (H) 10/04/2017   TRIG 59.0 10/04/2017   CHOLHDL 4 10/04/2017         Vitamin D deficiency    He continues to use 10,000 IUs daily despite warnigns in the past to reduce dose.  Based on today's level I have advised him to reduce dose to 5,000 Ius daily given the dramatic rise on 10,000 Ius      Relevant Orders   VITAMIN D 25 Hydroxy (Vit-D Deficiency, Fractures) (Completed)    Other Visit Diagnoses    Familial hypercholesterolemia    -  Primary   Relevant Orders   Lipid panel (Completed)   Muscle ache       Relevant Orders   Comprehensive metabolic panel (Completed)   Magnesium (Completed)   TSH (Completed)   Encounter for immunization       Relevant Orders   Flu vaccine HIGH DOSE PF (Completed)    A total of 25 minutes of face to face time was spent with patient more than half of which was spent in counselling about the above mentioned conditions  and coordination of care   I have discontinued [place]'s Red Yeast Rice. I am also having him start on Zoster Vaccine Adjuvanted. Additionally, I am having him maintain his vitamin C, Vitamin D3, thiamine, vitamin E, Lecithin, and Misc Natural Products (GLUCOSAMINE CHOND MSM FORMULA PO).  Meds ordered this encounter  Medications  . Zoster Vaccine Adjuvanted [facility](SHINGRIX) injection    Sig: Inject 0.5 mLs once for 1 dose into the muscle.     Dispense:  1 each    Refill:  1    Medications Discontinued During This Encounter  Medication Reason  . Red Yeast Rice 600 MG CAPS Patient has not taken in last 30 days    Follow-up: No Follow-up on file.   Richard Clayton ShamsULLO, Keierra Nudo L, MD

## 2017-10-06 ENCOUNTER — Encounter: Payer: Self-pay | Admitting: Internal Medicine

## 2017-10-06 NOTE — Assessment & Plan Note (Signed)
Managed with diet alone due to intolerance of statins and red yeast rice. Marland KitchenLDL has risen to 181 without RYR , and his 10  Yr risk of CAD is 20%  (using the FRC) . Referral to cardiology advised  Lab Results  Component Value Date   CHOL 249 (H) 10/04/2017   HDL 56.30 10/04/2017   LDLCALC 181 (H) 10/04/2017   TRIG 59.0 10/04/2017   CHOLHDL 4 10/04/2017

## 2017-10-06 NOTE — Assessment & Plan Note (Signed)
He continues to use 10,000 IUs daily despite warnigns in the past to reduce dose.  Based on today's level I have advised him to reduce dose to 5,000 Ius daily given the dramatic rise on 10,000 Ius

## 2017-10-12 ENCOUNTER — Other Ambulatory Visit: Payer: Self-pay | Admitting: Internal Medicine

## 2017-10-12 ENCOUNTER — Encounter: Payer: Self-pay | Admitting: Internal Medicine

## 2017-10-12 DIAGNOSIS — Z79899 Other long term (current) drug therapy: Secondary | ICD-10-CM

## 2017-10-12 MED ORDER — ROSUVASTATIN CALCIUM 5 MG PO TABS: 5.0000 mg | ORAL_TABLET | Freq: Every day | ORAL | 2 refills | Status: DC

## 2017-10-12 NOTE — Progress Notes (Signed)
Please print rosuvastain for pickup unless patient requests it be sent to pharmacy,  See my chart

## 2017-12-17 ENCOUNTER — Encounter: Payer: Self-pay | Admitting: Internal Medicine

## 2017-12-24 ENCOUNTER — Other Ambulatory Visit (INDEPENDENT_AMBULATORY_CARE_PROVIDER_SITE_OTHER): Payer: Medicare PPO

## 2017-12-24 DIAGNOSIS — Z79899 Other long term (current) drug therapy: Secondary | ICD-10-CM

## 2017-12-24 LAB — COMPREHENSIVE METABOLIC PANEL WITH GFR
ALT: 29 U/L (ref 0–53)
AST: 26 U/L (ref 0–37)
Albumin: 4.3 g/dL (ref 3.5–5.2)
Alkaline Phosphatase: 110 U/L (ref 39–117)
BUN: 15 mg/dL (ref 6–23)
CO2: 31 meq/L (ref 19–32)
Calcium: 9.7 mg/dL (ref 8.4–10.5)
Chloride: 102 meq/L (ref 96–112)
Creatinine, Ser: 0.99 mg/dL (ref 0.40–1.50)
GFR: 78.86 mL/min (ref >60.00)
Glucose, Bld: 99 mg/dL (ref 70–99)
Potassium: 4.2 meq/L (ref 3.5–5.1)
Sodium: 139 meq/L (ref 135–145)
Total Bilirubin: 1 mg/dL (ref 0.2–1.2)
Total Protein: 7.4 g/dL (ref 6.0–8.3)

## 2017-12-24 LAB — CK: CK TOTAL: 105 U/L (ref 7–232)

## 2017-12-26 ENCOUNTER — Encounter: Payer: Self-pay | Admitting: Internal Medicine

## 2018-01-08 ENCOUNTER — Other Ambulatory Visit: Payer: Self-pay | Admitting: Internal Medicine

## 2018-01-09 ENCOUNTER — Encounter: Payer: Self-pay | Admitting: Internal Medicine

## 2018-01-12 MED ORDER — ROSUVASTATIN CALCIUM 5 MG PO TABS: ORAL_TABLET | ORAL | 1 refills | Status: DC

## 2018-03-29 ENCOUNTER — Other Ambulatory Visit: Payer: Self-pay

## 2018-03-29 MED ORDER — ROSUVASTATIN CALCIUM 5 MG PO TABS: ORAL_TABLET | ORAL | 0 refills | Status: DC

## 2018-04-28 ENCOUNTER — Ambulatory Visit (INDEPENDENT_AMBULATORY_CARE_PROVIDER_SITE_OTHER): Payer: Medicare PPO | Admitting: Internal Medicine

## 2018-04-28 ENCOUNTER — Encounter: Payer: Self-pay | Admitting: Internal Medicine

## 2018-04-28 VITALS — BP 110/78 | HR 65 | Temp 98.0°F | Resp 15 | Ht 70.0 in | Wt 172.0 lb

## 2018-04-28 DIAGNOSIS — E785 Hyperlipidemia, unspecified: Secondary | ICD-10-CM | POA: Diagnosis not present

## 2018-04-28 DIAGNOSIS — Z9842 Cataract extraction status, left eye: Secondary | ICD-10-CM | POA: Diagnosis not present

## 2018-04-28 DIAGNOSIS — Z125 Encounter for screening for malignant neoplasm of prostate: Secondary | ICD-10-CM | POA: Diagnosis not present

## 2018-04-28 DIAGNOSIS — Z79899 Other long term (current) drug therapy: Secondary | ICD-10-CM

## 2018-04-28 DIAGNOSIS — Z0000 Encounter for general adult medical examination without abnormal findings: Secondary | ICD-10-CM

## 2018-04-28 LAB — COMPREHENSIVE METABOLIC PANEL
ALBUMIN: 4.3 g/dL (ref 3.5–5.2)
ALK PHOS: 102 U/L (ref 39–117)
ALT: 24 U/L (ref 0–53)
AST: 26 U/L (ref 0–37)
BILIRUBIN TOTAL: 0.6 mg/dL (ref 0.2–1.2)
BUN: 17 mg/dL (ref 6–23)
CALCIUM: 9.6 mg/dL (ref 8.4–10.5)
CHLORIDE: 101 meq/L (ref 96–112)
CO2: 28 mEq/L (ref 19–32)
CREATININE: 1.02 mg/dL (ref 0.40–1.50)
GFR: 76.12 mL/min (ref >60.00)
Glucose, Bld: 94 mg/dL (ref 70–99)
Potassium: 4.2 mEq/L (ref 3.5–5.1)
Sodium: 136 mEq/L (ref 135–145)
TOTAL PROTEIN: 7.1 g/dL (ref 6.0–8.3)

## 2018-04-28 LAB — LIPID PANEL
CHOLESTEROL: 162 mg/dL (ref 0–200)
HDL: 54.2 mg/dL (ref >39.00)
LDL CALC: 97 mg/dL (ref 0–99)
NonHDL: 107.59
TRIGLYCERIDES: 53 mg/dL (ref 0.0–149.0)
Total CHOL/HDL Ratio: 3
VLDL: 10.6 mg/dL (ref 0.0–40.0)

## 2018-04-28 LAB — PSA, MEDICARE: PSA: 2.25 ng/mL (ref 0.10–4.00)

## 2018-04-28 NOTE — Progress Notes (Signed)
Patient ID: Richard Clayton, male    DOB: [birth year]  Age: 72 y.o. MRN: [id number]  The patient is here for annual preventive examination and management of other chronic and acute problems.    The risk factors are reflected in the social history.  The roster of all physicians providing medical care to patient - is listed in the Snapshot section of the chart.  Activities of daily living:  The patient is 100% independent in all ADLs: dressing, toileting, feeding as well as independent mobility  Home safety : The patient has smoke detectors in the home. They wear seatbelts.  There are no firearms at home. There is no violence in the home.   There is no risks for hepatitis, STDs or HIV. There is no   history of blood transfusion. They have no travel history to infectious disease endemic areas of the world.  The patient has seen their dentist in the last six month. They have seen their eye doctor in the last year. They deny hearing difficulty with regard to whispered voices and some television programs.  They have deferred audiologic testing in the last year.  They do not  have excessive sun exposure. Discussed the need for sun protection: hats, long sleeves and use of sunscreen if there is significant sun exposure.   Diet: the importance of a healthy diet is discussed. They do have a healthy diet.  The benefits of regular aerobic exercise were discussed  He exercises daily for 45 to 60 minutes.   Depression screen: there are no signs or vegative symptoms of depression- irritability, change in appetite, anhedonia, sadness/tearfullness.  Cognitive assessment: the patient manages all their financial and personal affairs and is actively engaged. They could relate day,date,year and events; recalled 2/3 objects at 3 minutes; performed clock-face test normally.  The following portions of the patient's history were reviewed and updated as appropriate: allergies, current medications, past family history,  past medical history,  past surgical history, past social history  and problem list.  Visual acuity was not assessed per patient preference since she has regular follow up with her ophthalmologist. Hearing and body mass index were assessed and reviewed.   During the course of the visit the patient was educated and counseled about appropriate screening and preventive services including : fall prevention , diabetes screening, nutrition counseling, colorectal cancer screening, and recommended immunizations.    CC: The primary encounter diagnosis was Long-term use of high-risk medication. Diagnoses of Prostate cancer screening, Hyperlipidemia LDL goal <130, Encounter for preventive health examination, Hyperlipidemia with target LDL less than 160, and Cataract extraction status, left eye were also pertinent to this visit.  1) hyperlipidemia   Untreated LDL 181. tolerating 5 mg crestor.    Calf pain better but still there  Left shoulder pain has become persistent so he stopped working out for 4 weeks. Starting to feel his age.   Right  hand falling asleep  While holding up his book  But not while driving or working out   Had [employer] screening done .  Did not bring report but states that his  carotids  were assessed and normal,  His EKG  Noted an "extra heart  beat"  , his Bone density was normal , and his  BP was elevated   Stopped taking supplemental vitamin d completely   History [name] has a past medical history of Hemorrhoids and Hyperlipidemia.   He has a past surgical history that includes Tonsilectomy/adenoidectomy with myringotomy (1950); Eye surgery;  Cataract extraction w/PHACO (Left, 04/15/2015); Colonoscopy (2011); Tonsillectomy; and Colonoscopy with propofol (N/A, 09/04/2015).   His family history includes Cancer (age of onset: 53) in his father; Cancer (age of onset: 17) in his sister; Hyperlipidemia in his mother.He reports that he quit smoking about 49 years ago. His smoking use  included cigarettes. He smoked 1.00 pack per day. He has never used smokeless tobacco. He reports that he drinks alcohol. He reports that he does not use drugs.  Outpatient Medications Prior to Visit  Medication Sig Dispense Refill  . Lecithin 1200 MG CAPS Take 2 capsules by mouth daily.    . Misc Natural Products (GLUCOSAMINE CHOND MSM FORMULA PO) Take 1 tablet by mouth daily.    . rosuvastatin (CRESTOR) 5 MG tablet TAKE 1 TABLET(5 MG) BY MOUTH DAILY 90 tablet 0  . thiamine (VITAMIN B-1) 100 MG tablet Take 100 mg by mouth 2 (two) times daily.     . vitamin C (ASCORBIC ACID) 500 MG tablet Take 500 mg by mouth 2 (two) times daily.    . vitamin E 1000 UNIT capsule Take 1,000 Units by mouth 2 (two) times daily.    . Cholecalciferol (VITAMIN D3) 2000 units TABS Take 1 tablet by mouth daily.     No facility-administered medications prior to visit.     Review of Systems   Patient denies headache, fevers, malaise, unintentional weight loss, skin rash, eye pain, sinus congestion and sinus pain, sore throat, dysphagia,  hemoptysis , cough, dyspnea, wheezing, chest pain, palpitations, orthopnea, edema, abdominal pain, nausea, melena, diarrhea, constipation, flank pain, dysuria, hematuria, urinary  Frequency, nocturia, numbness, tingling, seizures,  Focal weakness, Loss of consciousness,  Tremor, insomnia, depression, anxiety, and suicidal ideation.      Objective:  BP 110/78 (BP Location: Left Arm, Patient Position: Sitting, Cuff Size: Normal)   Pulse 65   Temp 98 F (36.7 C) (Oral)   Resp 15   Ht 5\' 10"  (1.778 m)   Wt 172 lb (78 kg)   SpO2 97%   BMI 24.68 kg/m   Physical Exam   General appearance: alert, cooperative and appears stated age Ears: normal TM's and external ear canals both ears Throat: lips, mucosa, and tongue normal; teeth and gums normal Neck: no adenopathy, no carotid bruit, supple, symmetrical, trachea midline and thyroid not enlarged, symmetric, no  tenderness/mass/nodules Back: symmetric, no curvature. ROM normal. No CVA tenderness. Lungs: clear to auscultation bilaterally Heart: regular rate and rhythm, S1, S2 normal, no murmur, click, rub or gallop Abdomen: soft, non-tender; bowel sounds normal; no masses,  no organomegaly Pulses: 2+ and symmetric Skin: Skin color, texture, turgor normal. No rashes or lesions Lymph nodes: Cervical, supraclavicular, and axillary nodes normal.    Assessment & Plan:   Problem List Items Addressed This Visit    Prostate cancer screening   Relevant Orders   PSA, Medicare (Completed)   Hyperlipidemia with target LDL less than 160   Encounter for preventive health examination    Annual comprehensive preventive exam was done as well as an evaluation and management of acute and chronic conditions .  During the course of the visit the patient was educated and counseled about appropriate screening and preventive services including :  diabetes screening, lipid analysis with projected  10 year  risk for CAD , nutrition counseling, prostate and colorectal cancer screening, and recommended immunizations.  Printed recommendations for health maintenance screenings was given.   Lab Results  Component Value Date   PSA 2.25 04/28/2018  PSA 1.59 07/13/2014          Cataract extraction status, left eye    Other Visit Diagnoses    Long-term use of high-risk medication    -  Primary   Relevant Orders   Comprehensive metabolic panel (Completed)   Hyperlipidemia LDL goal <130       Relevant Orders   Lipid panel (Completed)      I have discontinued Kaydan Shatswell's Vitamin D3. I am also having him maintain his vitamin C, thiamine, vitamin E, Lecithin, Misc Natural Products (GLUCOSAMINE CHOND MSM FORMULA PO), and rosuvastatin.  No orders of the defined types were placed in this encounter.   Medications Discontinued During This Encounter  Medication Reason  . Cholecalciferol (VITAMIN D3) 2000 units  TABS Patient has not taken in last 30 days    Follow-up: Return in about 6 months (around 10/29/2018) for follw yup hyperlipidemia.   [name] Shams, MD

## 2018-04-28 NOTE — Patient Instructions (Signed)
Good to see you!  I'll send you the results of your labs in a few days  No more daily aspirin (risks outweigh the benefits)     Health Maintenance, Male A healthy lifestyle and preventive care is important for your health and wellness. Ask your health care provider about what schedule of regular examinations is right for you. What should I know about weight and diet? Eat a Healthy Diet  Eat plenty of vegetables, fruits, whole grains, low-fat dairy products, and lean protein.  Do not eat a lot of foods high in solid fats, added sugars, or salt.  Maintain a Healthy Weight Regular exercise can help you achieve or maintain a healthy weight. You should:  Do at least 150 minutes of exercise each week. The exercise should increase your heart rate and make you sweat (moderate-intensity exercise).  Do strength-training exercises at least twice a week.  Watch Your Levels of Cholesterol and Blood Lipids  Have your blood tested for lipids and cholesterol every 5 years starting at 73 years of age. If you are at high risk for heart disease, you should start having your blood tested when you are 73 years old. You may need to have your cholesterol levels checked more often if: ? Your lipid or cholesterol levels are high. ? You are older than 73 years of age. ? You are at high risk for heart disease.  What should I know about cancer screening? Many types of cancers can be detected early and may often be prevented. Lung Cancer  You should be screened every year for lung cancer if: ? You are a current smoker who has smoked for at least 30 years. ? You are a former smoker who has quit within the past 15 years.  Talk to your health care provider about your screening options, when you should start screening, and how often you should be screened.  Colorectal Cancer  Routine colorectal cancer screening usually begins at 73 years of age and should be repeated every 5-10 years until you are 73 years  old. You may need to be screened more often if early forms of precancerous polyps or small growths are found. Your health care provider may recommend screening at an earlier age if you have risk factors for colon cancer.  Your health care provider may recommend using home test kits to check for hidden blood in the stool.  A small camera at the end of a tube can be used to examine your colon (sigmoidoscopy or colonoscopy). This checks for the earliest forms of colorectal cancer.  Prostate and Testicular Cancer  Depending on your age and overall health, your health care provider may do certain tests to screen for prostate and testicular cancer.  Talk to your health care provider about any symptoms or concerns you have about testicular or prostate cancer.  Skin Cancer  Check your skin from head to toe regularly.  Tell your health care provider about any new moles or changes in moles, especially if: ? There is a change in a mole's size, shape, or color. ? You have a mole that is larger than a pencil eraser.  Always use sunscreen. Apply sunscreen liberally and repeat throughout the day.  Protect yourself by wearing long sleeves, pants, a wide-brimmed hat, and sunglasses when outside.  What should I know about heart disease, diabetes, and high blood pressure?  If you are 29-48 years of age, have your blood pressure checked every 3-5 years. If you are 40 years  of age or older, have your blood pressure checked every year. You should have your blood pressure measured twice-once when you are at a hospital or clinic, and once when you are not at a hospital or clinic. Record the average of the two measurements. To check your blood pressure when you are not at a hospital or clinic, you can use: ? An automated blood pressure machine at a pharmacy. ? A home blood pressure monitor.  Talk to your health care provider about your target blood pressure.  If you are between 29-10 years old, ask your  health care provider if you should take aspirin to prevent heart disease.  Have regular diabetes screenings by checking your fasting blood sugar level. ? If you are at a normal weight and have a low risk for diabetes, have this test once every three years after the age of 16. ? If you are overweight and have a high risk for diabetes, consider being tested at a younger age or more often.  A one-time screening for abdominal aortic aneurysm (AAA) by ultrasound is recommended for men aged 33-75 years who are current or former smokers. What should I know about preventing infection? Hepatitis B If you have a higher risk for hepatitis B, you should be screened for this virus. Talk with your health care provider to find out if you are at risk for hepatitis B infection. Hepatitis C Blood testing is recommended for:  Everyone born from [birth year] through 1966.  Anyone with known risk factors for hepatitis C.  Sexually Transmitted Diseases (STDs)  You should be screened each year for STDs including gonorrhea and chlamydia if: ? You are sexually active and are younger than 73 years of age. ? You are older than 73 years of age and your health care provider tells you that you are at risk for this type of infection. ? Your sexual activity has changed since you were last screened and you are at an increased risk for chlamydia or gonorrhea. Ask your health care provider if you are at risk.  Talk with your health care provider about whether you are at high risk of being infected with HIV. Your health care provider may recommend a prescription medicine to help prevent HIV infection.  What else can I do?  Schedule regular health, dental, and eye exams.  Stay current with your vaccines (immunizations).  Do not use any tobacco products, such as cigarettes, chewing tobacco, and e-cigarettes. If you need help quitting, ask your health care provider.  Limit alcohol intake to no more than 2 drinks per day. One  drink equals 12 ounces of beer, 5 ounces of wine, or 1 ounces of hard liquor.  Do not use street drugs.  Do not share needles.  Ask your health care provider for help if you need support or information about quitting drugs.  Tell your health care provider if you often feel depressed.  Tell your health care provider if you have ever been abused or do not feel safe at home. This information is not intended to replace advice given to you by your health care provider. Make sure you discuss any questions you have with your health care provider. Document Released: 05/14/2008 Document Revised: 07/15/2016 Document Reviewed: 08/20/2015 Elsevier Interactive Patient Education  [employer].

## 2018-05-01 ENCOUNTER — Encounter: Payer: Self-pay | Admitting: Internal Medicine

## 2018-05-01 DIAGNOSIS — Z9842 Cataract extraction status, left eye: Secondary | ICD-10-CM | POA: Insufficient documentation

## 2018-05-01 NOTE — Assessment & Plan Note (Signed)
Annual comprehensive preventive exam was done as well as an evaluation and management of acute and chronic conditions .  During the course of the visit the patient was educated and counseled about appropriate screening and preventive services including :  diabetes screening, lipid analysis with projected  10 year  risk for CAD , nutrition counseling, prostate and colorectal cancer screening, and recommended immunizations.  Printed recommendations for health maintenance screenings was given.   Lab Results  Component Value Date   PSA 2.25 04/28/2018   PSA 1.59 07/13/2014

## 2018-05-16 ENCOUNTER — Telehealth: Payer: Self-pay | Admitting: Internal Medicine

## 2018-05-16 NOTE — Telephone Encounter (Signed)
Pt dropped off labs for Dr. Darrick Huntsman to review. Papers are up front in Dr. Melina Schools colored folder.

## 2018-05-17 NOTE — Telephone Encounter (Signed)
Placed in yellow folder.  

## 2018-06-03 ENCOUNTER — Other Ambulatory Visit: Payer: Self-pay | Admitting: Internal Medicine

## 2018-09-21 DIAGNOSIS — Z961 Presence of intraocular lens: Secondary | ICD-10-CM | POA: Diagnosis not present

## 2018-10-10 ENCOUNTER — Encounter: Payer: Self-pay | Admitting: Internal Medicine

## 2018-10-10 ENCOUNTER — Ambulatory Visit: Payer: Medicare PPO | Admitting: Internal Medicine

## 2018-10-10 VITALS — BP 122/74 | HR 61 | Temp 97.6°F | Resp 16 | Ht 70.0 in | Wt 173.0 lb

## 2018-10-10 DIAGNOSIS — Z125 Encounter for screening for malignant neoplasm of prostate: Secondary | ICD-10-CM

## 2018-10-10 DIAGNOSIS — Z23 Encounter for immunization: Secondary | ICD-10-CM | POA: Diagnosis not present

## 2018-10-10 DIAGNOSIS — E785 Hyperlipidemia, unspecified: Secondary | ICD-10-CM

## 2018-10-10 NOTE — Progress Notes (Signed)
Subjective:  Patient ID: Richard Clayton, male    DOB: Jun 22, 1946  Age: 72 y.o. MRN: [id number]  CC: The primary encounter diagnosis was Prostate cancer screening. Diagnoses of Encounter for immunization and Hyperlipidemia with target LDL less than 160 were also pertinent to this visit.  HPI Herrick Hafeman presents for follow up on hyperlipidemia now managed with diet due to statin intolerance.  Treated LDL was 97 in May ,  181 without treatment , lifetime risk is 20%.  Normal stress test about 5  years ago. Walking 2 miles daily ,  Lifts  weights  3 TIMES WEEK.  denies chest pain  .  USING GLUCOSAMINE  .  Thoracic AA stable and pulm nodules, all  stable by last CT 2012  priro to relocation to [place]> . Discussed referral to [facility]  For follow up  Last stress test normal in 2014. Discussed cardiac CT   Taking no vitamin D  .  Was taking 10,000 Ius daily in the past.    Lab Results  Component Value Date   PSA 2.25 04/28/2018   PSA 1.59 07/13/2014     Outpatient Medications Prior to Visit  Medication Sig Dispense Refill  . Misc Natural Products (GLUCOSAMINE CHOND MSM FORMULA PO) Take 1 tablet by mouth daily.    . Lecithin 1200 MG CAPS Take 2 capsules by mouth daily.    . rosuvastatin (CRESTOR) 5 MG tablet TAKE 1 TABLET EVERY DAY 90 tablet 1  . thiamine (VITAMIN B-1) 100 MG tablet Take 100 mg by mouth 2 (two) times daily.     . vitamin C (ASCORBIC ACID) 500 MG tablet Take 500 mg by mouth 2 (two) times daily.    . vitamin E 1000 UNIT capsule Take 1,000 Units by mouth 2 (two) times daily.     No facility-administered medications prior to visit.     Review of Systems;  Patient denies headache, fevers, malaise, unintentional weight loss, skin rash, eye pain, sinus congestion and sinus pain, sore throat, dysphagia,  hemoptysis , cough, dyspnea, wheezing, chest pain, palpitations, orthopnea, edema, abdominal pain, nausea, melena, diarrhea, constipation, flank pain,  dysuria, hematuria, urinary  Frequency, nocturia, numbness, tingling, seizures,  Focal weakness, Loss of consciousness,  Tremor, insomnia, depression, anxiety, and suicidal ideation.      Objective:  BP 122/74 (BP Location: Left Arm, Patient Position: Sitting, Cuff Size: Normal)   Pulse 61   Temp 97.6 F (36.4 C) (Oral)   Resp 16   Ht 5\' 10"  (1.778 m)   Wt 173 lb (78.5 kg)   SpO2 97%   BMI 24.82 kg/m   BP Readings from Last 3 Encounters:  10/10/18 122/74  04/28/18 110/78  10/04/17 112/72    Wt Readings from Last 3 Encounters:  10/10/18 173 lb (78.5 kg)  04/28/18 172 lb (78 kg)  10/04/17 172 lb 6.4 oz (78.2 kg)    General appearance: alert, cooperative and appears stated age Ears: normal TM's and external ear canals both ears Throat: lips, mucosa, and tongue normal; teeth and gums normal Neck: no adenopathy, no carotid bruit, supple, symmetrical, trachea midline and thyroid not enlarged, symmetric, no tenderness/mass/nodules Back: symmetric, no curvature. ROM normal. No CVA tenderness. Lungs: clear to auscultation bilaterally Heart: regular rate and rhythm, S1, S2 normal, no murmur, click, rub or gallop Abdomen: soft, non-tender; bowel sounds normal; no masses,  no organomegaly Pulses: 2+ and symmetric Skin: Skin color, texture, turgor normal. No rashes or lesions Lymph nodes: Cervical, supraclavicular,  and axillary nodes normal.  No results found for: HGBA1C  Lab Results  Component Value Date   CREATININE 1.02 04/28/2018   CREATININE 0.99 12/24/2017   CREATININE 1.00 10/04/2017    Lab Results  Component Value Date   WBC 4.8 03/31/2017   HGB 15.0 03/31/2017   HCT 44.3 03/31/2017   PLT 214.0 03/31/2017   GLUCOSE 94 04/28/2018   CHOL 162 04/28/2018   TRIG 53.0 04/28/2018   HDL 54.20 04/28/2018   LDLCALC 97 04/28/2018   ALT 24 04/28/2018   AST 26 04/28/2018   NA 136 04/28/2018   K 4.2 04/28/2018   CL 101 04/28/2018   CREATININE 1.02 04/28/2018   BUN 17  04/28/2018   CO2 28 04/28/2018   TSH 2.09 10/04/2017   PSA 2.25 04/28/2018   MICROALBUR 0.2 12/13/2013    No results found.  Assessment & Plan:   Problem List Items Addressed This Visit    Hyperlipidemia with target LDL less than 160    Untreated due to recurrent statin intolerance. He is concerned about his risk of CAD and requesting a stress test. Advised him to consier a Cardiac CT  Which he has accepted      Relevant Orders   Lipid panel   Comprehensive metabolic panel   TSH   CT CARDIAC SCORING   Prostate cancer screening - Primary   Relevant Orders   PSA, Medicare ( [place] only)    Other Visit Diagnoses    Encounter for immunization       Relevant Orders   Flu vaccine HIGH DOSE PF (Completed)    A total of 25 minutes of face to face time was spent with patient more than half of which was spent in counselling about the above mentioned conditions  and coordination of care   I have discontinued Miro Totino's vitamin C, thiamine, vitamin E, Lecithin, and rosuvastatin. I am also having him maintain his Misc Natural Products (GLUCOSAMINE CHOND MSM FORMULA PO).  No orders of the defined types were placed in this encounter.   Medications Discontinued During This Encounter  Medication Reason  . vitamin C (ASCORBIC ACID) 500 MG tablet Error  . thiamine (VITAMIN B-1) 100 MG tablet Error  . vitamin E 1000 UNIT capsule Error  . Lecithin 1200 MG CAPS Error  . rosuvastatin (CRESTOR) 5 MG tablet Error    Follow-up: Return in about 6 months (around 04/10/2019).   [name] Shams, MD

## 2018-10-10 NOTE — Assessment & Plan Note (Signed)
Untreated due to recurrent statin intolerance. He is concerned about his risk of CAD and requesting a stress test. Advised him to consier a Cardiac CT  Which he has accepted

## 2018-10-10 NOTE — Patient Instructions (Addendum)
Take 2000  Ius of D3 daily from  November   Through march     Cardiac CT ordered    To assess coronary artery calcification   And lungs    Coronary Calcium Scan A coronary calcium scan is an imaging test used to look for deposits of calcium and other fatty materials (plaques) in the inner lining of the blood vessels of the heart (coronary arteries). These deposits of calcium and plaques can partly clog and narrow the coronary arteries without producing any symptoms or warning signs. This puts a person at risk for a heart attack. This test can detect these deposits before symptoms develop. Tell a health care provider about:  Any allergies you have.  All medicines you are taking, including vitamins, herbs, eye drops, creams, and over-the-counter medicines.  Any problems you or family members have had with anesthetic medicines.  Any blood disorders you have.  Any surgeries you have had.  Any medical conditions you have.  Whether you are pregnant or may be pregnant. What are the risks? Generally, this is a safe procedure. However, problems may occur, including:  Harm to a pregnant woman and her unborn baby. This test involves the use of radiation. Radiation exposure can be dangerous to a pregnant woman and her unborn baby. If you are pregnant, you generally should not have this procedure done.  Slight increase in the risk of cancer. This is because of the radiation involved in the test.  What happens before the procedure? No preparation is needed for this procedure. What happens during the procedure?  You will undress and remove any jewelry around your neck or chest.  You will put on a hospital gown.  Sticky electrodes will be placed on your chest. The electrodes will be connected to an electrocardiogram (ECG) machine to record a tracing of the electrical activity of your heart.  A CT scanner will take pictures of your heart. During this time, you will be asked to lie still and  hold your breath for 2-3 seconds while a picture of your heart is being taken. The procedure may vary among health care providers and hospitals. What happens after the procedure?  You can get dressed.  You can return to your normal activities.  It is up to you to get the results of your test. Ask your health care provider, or the department that is doing the test, when your results will be ready. Summary  A coronary calcium scan is an imaging test used to look for deposits of calcium and other fatty materials (plaques) in the inner lining of the blood vessels of the heart (coronary arteries).  Generally, this is a safe procedure. Tell your health care provider if you are pregnant or may be pregnant.  No preparation is needed for this procedure.  A CT scanner will take pictures of your heart.  You can return to your normal activities after the scan is done. This information is not intended to replace advice given to you by your health care provider. Make sure you discuss any questions you have with your health care provider. Document Released: 05/14/2008 Document Revised: 10/05/2016 Document Reviewed: 10/05/2016 Elsevier Interactive Patient Education  2017 [employer].

## 2018-11-07 ENCOUNTER — Ambulatory Visit (INDEPENDENT_AMBULATORY_CARE_PROVIDER_SITE_OTHER)
Admission: RE | Admit: 2018-11-07 | Discharge: 2018-11-07 | Disposition: A | Discharge status: Home / Self Care | Payer: Self-pay | Source: Ambulatory Visit | Attending: Internal Medicine | Admitting: Internal Medicine

## 2018-11-07 DIAGNOSIS — E785 Hyperlipidemia, unspecified: Secondary | ICD-10-CM

## 2018-11-09 ENCOUNTER — Encounter: Payer: Self-pay | Admitting: Internal Medicine

## 2018-11-09 DIAGNOSIS — I77810 Thoracic aortic ectasia: Secondary | ICD-10-CM

## 2018-11-14 ENCOUNTER — Ambulatory Visit (INDEPENDENT_AMBULATORY_CARE_PROVIDER_SITE_OTHER): Payer: Medicare PPO | Admitting: Nurse Practitioner

## 2018-11-14 ENCOUNTER — Telehealth: Payer: Self-pay | Admitting: Internal Medicine

## 2018-11-14 ENCOUNTER — Encounter (INDEPENDENT_AMBULATORY_CARE_PROVIDER_SITE_OTHER): Payer: Self-pay | Admitting: Vascular Surgery

## 2018-11-14 VITALS — BP 119/79 | HR 73 | Resp 12 | Ht 71.0 in | Wt 171.0 lb

## 2018-11-14 DIAGNOSIS — Z87891 Personal history of nicotine dependence: Secondary | ICD-10-CM

## 2018-11-14 DIAGNOSIS — E785 Hyperlipidemia, unspecified: Secondary | ICD-10-CM | POA: Diagnosis not present

## 2018-11-14 DIAGNOSIS — I77810 Thoracic aortic ectasia: Secondary | ICD-10-CM | POA: Diagnosis not present

## 2018-11-14 DIAGNOSIS — I712 Thoracic aortic aneurysm, without rupture, unspecified: Secondary | ICD-10-CM

## 2018-11-14 NOTE — Telephone Encounter (Signed)
Records supplied by patient  from 2003 to 2012 reviewed; serial surveillance of aortic root has been done and only a 0.3 cm change from 2012 to current .

## 2018-11-15 ENCOUNTER — Encounter (INDEPENDENT_AMBULATORY_CARE_PROVIDER_SITE_OTHER): Payer: Self-pay | Admitting: Nurse Practitioner

## 2018-11-15 NOTE — Progress Notes (Signed)
Subjective:    Patient ID: Richard Clayton, male    DOB: [birth year], 72 y.o.   MRN: [id number] Chief Complaint  Patient presents with  . New Patient (Initial Visit)    HPI  Richard Clayton is a 72 y.o. male that presents today for follow-up of his CT scan which revealed a thoracic aortic aneurysm.  The patient recently relocated from [place] several years ago and has not had his aneurysm followed.  The patient has kept meticulous records as to whether his aneurysm has been growing.  Based on his records, his history is as follows:  02/09/2002 the ascending thoracic aortic aneurysm measured 4 cm.  11/21/2002 there was no value reported, which the patient states was due to to a reading less than 3.9.  03/23/2003 the results were the same as above  03/25/2005 it measured 4 x 4 cm  06/15/2006 the aneurysm measured 4 cm x 3.9 cm  07/10/2008 the aneurysm measured 4.1 cm x 4.1 cm  01/29/2009 aneurysm measured 4.1 cm x 4.1 cm  03/10/2011 the aneurysm measured 4 cm x 4.2 cm  Latest reading on 11/07/2018, the aneurysm measured 4.3 cm.  The patient denies any chest pain or shortness of breath.  He denies any TIA-like symptoms.  He denies any dizziness or syncope.  He denies any amaurosis fugax.  He denies any fever, chills, nausea, vomiting or diarrhea.  Past Medical History:  Diagnosis Date  . Hemorrhoids   . Hyperlipidemia     Past Surgical History:  Procedure Laterality Date  . CATARACT EXTRACTION W/PHACO Left 04/15/2015   Procedure: CATARACT EXTRACTION PHACO AND INTRAOCULAR LENS PLACEMENT (IOC);  Surgeon: Sallee Lange, MD;  Location: [facility] ORS;  Service: Ophthalmology;  Laterality: Left;  [place] 00:47 AP% 22.7 CDE 20.56 Note:suture placed in left eye at end of procedure  . COLONOSCOPY  2011   [place].  . COLONOSCOPY WITH PROPOFOL N/A 09/04/2015   Procedure: COLONOSCOPY WITH PROPOFOL;  Surgeon: Earline Mayotte, MD;  Location: [facility] ENDOSCOPY;  Service: Endoscopy;  Laterality: N/A;   . EYE SURGERY     cataract  . TONSILECTOMY/ADENOIDECTOMY WITH MYRINGOTOMY  1950  . TONSILLECTOMY      Social History   Socioeconomic History  . Marital status: Single    Spouse name: Not on file  . Number of children: Not on file  . Years of education: Not on file  . Highest education level: Not on file  Occupational History  . Not on file  Social Needs  . Financial resource strain: Not on file  . Food insecurity:    Worry: Not on file    Inability: Not on file  . Transportation needs:    Medical: Not on file    Non-medical: Not on file  Tobacco Use  . Smoking status: Former Smoker    Packs/day: 1.00    Types: Cigarettes    Last attempt to quit: 12/13/1968    Years since quitting: 49.9  . Smokeless tobacco: Never Used  Substance and Sexual Activity  . Alcohol use: Yes    Comment: wine in the evenings  . Drug use: No  . Sexual activity: Yes  Lifestyle  . Physical activity:    Days per week: Not on file    Minutes per session: Not on file  . Stress: Not on file  Relationships  . Social connections:    Talks on phone: Not on file    Gets together: Not on file    Attends religious  service: Not on file    Active member of club or organization: Not on file    Attends meetings of clubs or organizations: Not on file    Relationship status: Not on file  . Intimate partner violence:    Fear of current or ex partner: Not on file    Emotionally abused: Not on file    Physically abused: Not on file    Forced sexual activity: Not on file  Other Topics Concern  . Not on file  Social History Narrative  . Not on file    Family History  Problem Relation Age of Onset  . Hyperlipidemia Mother   . Cancer Father 40       from working in [employer]  . Cancer Sister 98       in remission    No Known Allergies   Review of Systems   Review of Systems: Negative Unless Checked Constitutional: [] Weight loss  [] Fever  [] Chills Cardiac: [] Chest pain   []  Atrial  Fibrillation  [] Palpitations   [] Shortness of breath when laying flat   [] Shortness of breath with exertion. Vascular:  [] Pain in legs with walking   [] Pain in legs with standing  [] History of DVT   [] Phlebitis   [] Swelling in legs   [] Varicose veins   [] Non-healing ulcers Pulmonary:   [] Uses home oxygen   [] Productive cough   [] Hemoptysis   [] Wheeze  [] COPD   [] Asthma Neurologic:  [] Dizziness   [] Seizures   [] History of stroke   [] History of TIA  [] Aphasia   [] Vissual changes   [] Weakness or numbness in arm   [] Weakness or numbness in leg Musculoskeletal:   [] Joint swelling   [] Joint pain   [] Low back pain  []  History of Knee Replacement Hematologic:  [] Easy bruising  [] Easy bleeding   [] Hypercoagulable state   [] Anemic Gastrointestinal:  [] Diarrhea   [] Vomiting  [] Gastroesophageal reflux/heartburn   [] Difficulty swallowing. Genitourinary:  [] Chronic kidney disease   [] Difficult urination  [] Anuric   [] Blood in urine Skin:  [] Rashes   [] Ulcers  Psychological:  [] History of anxiety   []  History of major depression  []  Memory Difficulties     Objective:   Physical Exam  BP 119/79 (BP Location: Right Arm, Patient Position: Sitting)   Pulse 73   Resp 12   Ht 5\' 11"  (1.803 m)   Wt 171 lb (77.6 kg)   BMI 23.85 kg/m   Gen: WD/WN, NAD Head: [place]/AT, No temporalis wasting.  Ear/Nose/Throat: Hearing grossly intact, nares w/o erythema or drainage Eyes: PER, EOMI, sclera nonicteric.  Neck: Supple, no masses.  No JVD.  Pulmonary:  Good air movement, no use of accessory muscles.  Cardiac: RRR Vascular:  Vessel Right Left  Radial Palpable Palpable   Gastrointestinal: soft, non-distended. No guarding/no peritoneal signs.  Musculoskeletal: M/S 5/5 throughout.  No deformity or atrophy.  Neurologic: Pain and light touch intact in extremities.  Symmetrical.  Speech is fluent. Motor exam as listed above. Psychiatric: Judgment intact, Mood & affect appropriate for pt's clinical  situation. Dermatologic: No Venous rashes. No Ulcers Noted.  No changes consistent with cellulitis. Lymph : No Cervical lymphadenopathy, no lichenification or skin changes of chronic lymphedema.      Assessment & Plan:   1. Aortic root dilatation (HCC) See below   - CT Angio Chest W/Cm &/Or Wo Cm; Future  2. Hyperlipidemia with target LDL less than 160 Continue statin as ordered and reviewed, no changes at this time   3.  Thoracic aortic aneurysm without rupture (HCC)  Currently the patient is not symptomatic at all, he has no current valvular disease or genetic connective tissue diseases.  The patient also has excellent blood pressure control.  Given very little growth in 16 years I think it is prudent to follow-up with a repeat CT angios of the chest in 2 years.  Patient is in agreement with the plan.  Also advised the patient to maintain close watch over his blood pressure, cholesterol, as well as to not utilize tobacco products. - CT Angio Chest W/Cm &/Or Wo Cm; Future   Current Outpatient Medications on File Prior to Visit  Medication Sig Dispense Refill  . Misc Natural Products (GLUCOSAMINE CHOND MSM FORMULA PO) Take 1 tablet by mouth daily.     No current facility-administered medications on file prior to visit.     There are no Patient Instructions on file for this visit. Return in about 2 years (around 11/14/2020).   Georgiana Spinner, NP  This note was completed with Office manager.  Any errors are purely unintentional.

## 2019-04-25 DIAGNOSIS — M2550 Pain in unspecified joint: Secondary | ICD-10-CM | POA: Diagnosis not present

## 2019-04-25 DIAGNOSIS — M6281 Muscle weakness (generalized): Secondary | ICD-10-CM | POA: Diagnosis not present

## 2019-04-25 DIAGNOSIS — R279 Unspecified lack of coordination: Secondary | ICD-10-CM | POA: Diagnosis not present

## 2019-05-09 DIAGNOSIS — R279 Unspecified lack of coordination: Secondary | ICD-10-CM | POA: Diagnosis not present

## 2019-05-09 DIAGNOSIS — M2550 Pain in unspecified joint: Secondary | ICD-10-CM | POA: Diagnosis not present

## 2019-05-09 DIAGNOSIS — M6281 Muscle weakness (generalized): Secondary | ICD-10-CM | POA: Diagnosis not present

## 2019-05-11 DIAGNOSIS — M6281 Muscle weakness (generalized): Secondary | ICD-10-CM | POA: Diagnosis not present

## 2019-05-11 DIAGNOSIS — M2550 Pain in unspecified joint: Secondary | ICD-10-CM | POA: Diagnosis not present

## 2019-05-11 DIAGNOSIS — R279 Unspecified lack of coordination: Secondary | ICD-10-CM | POA: Diagnosis not present

## 2019-05-16 DIAGNOSIS — R279 Unspecified lack of coordination: Secondary | ICD-10-CM | POA: Diagnosis not present

## 2019-05-16 DIAGNOSIS — M2550 Pain in unspecified joint: Secondary | ICD-10-CM | POA: Diagnosis not present

## 2019-05-16 DIAGNOSIS — M6281 Muscle weakness (generalized): Secondary | ICD-10-CM | POA: Diagnosis not present

## 2019-05-18 DIAGNOSIS — M2550 Pain in unspecified joint: Secondary | ICD-10-CM | POA: Diagnosis not present

## 2019-05-18 DIAGNOSIS — M6281 Muscle weakness (generalized): Secondary | ICD-10-CM | POA: Diagnosis not present

## 2019-05-18 DIAGNOSIS — R279 Unspecified lack of coordination: Secondary | ICD-10-CM | POA: Diagnosis not present

## 2019-07-04 ENCOUNTER — Encounter: Payer: Self-pay | Admitting: General Surgery

## 2019-09-25 DIAGNOSIS — Z961 Presence of intraocular lens: Secondary | ICD-10-CM | POA: Diagnosis not present

## 2020-02-09 ENCOUNTER — Telehealth: Payer: Self-pay | Admitting: Internal Medicine

## 2020-02-09 NOTE — Telephone Encounter (Signed)
Left message for patient to call back and schedule Medicare Annual Wellness Visit (AWV) either virtually or audio only.  Last AWV 9.21.16; please schedule at anytime with Denisa O'Brien-Blaney at [facility].  Pt also needs OV with PCP-last seen in 09/2018

## 2020-02-28 ENCOUNTER — Other Ambulatory Visit: Payer: Self-pay

## 2020-03-04 ENCOUNTER — Encounter: Payer: Self-pay | Admitting: Internal Medicine

## 2020-03-04 ENCOUNTER — Ambulatory Visit: Payer: Medicare PPO | Admitting: Internal Medicine

## 2020-03-04 ENCOUNTER — Other Ambulatory Visit: Payer: Self-pay

## 2020-03-04 VITALS — BP 116/84 | HR 67 | Temp 97.7°F | Ht 71.0 in | Wt 168.2 lb

## 2020-03-04 DIAGNOSIS — Z86010 Personal history of colonic polyps: Secondary | ICD-10-CM | POA: Diagnosis not present

## 2020-03-04 DIAGNOSIS — Z0000 Encounter for general adult medical examination without abnormal findings: Secondary | ICD-10-CM

## 2020-03-04 DIAGNOSIS — Z125 Encounter for screening for malignant neoplasm of prostate: Secondary | ICD-10-CM

## 2020-03-04 DIAGNOSIS — E785 Hyperlipidemia, unspecified: Secondary | ICD-10-CM | POA: Diagnosis not present

## 2020-03-04 DIAGNOSIS — I77810 Thoracic aortic ectasia: Secondary | ICD-10-CM | POA: Diagnosis not present

## 2020-03-04 DIAGNOSIS — E559 Vitamin D deficiency, unspecified: Secondary | ICD-10-CM

## 2020-03-04 NOTE — Assessment & Plan Note (Signed)
He has decided to forgo any additional scanning since his aortic root dilatation has not increased in 16 years

## 2020-03-04 NOTE — Assessment & Plan Note (Addendum)
He has stopped taking Crestor due to statin intolerance and his noninvasive testing with coronary CT showed a very low calcium score of 5.9 (22nc percentile)

## 2020-03-04 NOTE — Assessment & Plan Note (Signed)

## 2020-03-04 NOTE — Patient Instructions (Addendum)
Good to see you Richard Clayton!  Glad to see you are doing so well!  Your colonoscopy is due in the Fall.   I will arrange the referral with Dr Lemar Livings     Diverticulosis  Diverticulosis is a condition that develops when small pouches (diverticula) form in the wall of the large intestine (colon). The colon is where water is absorbed and stool (feces) is formed. The pouches form when the inside layer of the colon pushes through weak spots in the outer layers of the colon. You may have a few pouches or many of them. The pouches usually do not cause problems unless they become inflamed or infected. When this happens, the condition is called diverticulitis. What are the causes? The cause of this condition is not known. What increases the risk? The following factors may make you more likely to develop this condition:  Being older than age 16. Your risk for this condition increases with age. Diverticulosis is rare among people younger than age 86. By age 67, many people have it.  Eating a low-fiber diet.  Having frequent constipation.  Being overweight.  Not getting enough exercise.  Smoking.  Taking over-the-counter pain medicines, like aspirin and ibuprofen.  Having a family history of diverticulosis. What are the signs or symptoms? In most people, there are no symptoms of this condition. If you do have symptoms, they may include:  Bloating.  Cramps in the abdomen.  Constipation or diarrhea.  Pain in the lower left side of the abdomen. How is this diagnosed? Because diverticulosis usually has no symptoms, it is most often diagnosed during an exam for other colon problems. The condition may be diagnosed by:  Using a flexible scope to examine the colon (colonoscopy).  Taking an X-ray of the colon after dye has been put into the colon (barium enema).  Having a CT scan. How is this treated? You may not need treatment for this condition. Your health care provider may recommend treatment  to prevent problems. You may need treatment if you have symptoms or if you previously had diverticulitis. Treatment may include:  Eating a high-fiber diet.  Taking a fiber supplement.  Taking a live bacteria supplement (probiotic).  Taking medicine to relax your colon. Follow these instructions at home: Medicines  Take over-the-counter and prescription medicines only as told by your health care provider.  If told by your health care provider, take a fiber supplement or probiotic. Constipation prevention Your condition may cause constipation. To prevent or treat constipation, you may need to:  Drink enough fluid to keep your urine pale yellow.  Take over-the-counter or prescription medicines.  Eat foods that are high in fiber, such as beans, whole grains, and fresh fruits and vegetables.  Limit foods that are high in fat and processed sugars, such as fried or sweet foods.  General instructions  Try not to strain when you have a bowel movement.  Keep all follow-up visits as told by your health care provider. This is important. Contact a health care provider if you:  Have pain in your abdomen.  Have bloating.  Have cramps.  Have not had a bowel movement in 3 days. Get help right away if:  Your pain gets worse.  Your bloating becomes very bad.  You have a fever or chills, and your symptoms suddenly get worse.  You vomit.  You have bowel movements that are bloody or black.  You have bleeding from your rectum. Summary  Diverticulosis is a condition that develops when  small pouches (diverticula) form in the wall of the large intestine (colon).  You may have a few pouches or many of them.  This condition is most often diagnosed during an exam for other colon problems.  Treatment may include increasing the fiber in your diet, taking supplements, or taking medicines. This information is not intended to replace advice given to you by your health care provider. Make  sure you discuss any questions you have with your health care provider. Document Revised: 06/15/2019 Document Reviewed: 06/15/2019 Elsevier Patient Education  [place].

## 2020-03-04 NOTE — Progress Notes (Signed)
Patient ID: Richard Clayton, male    DOB: [birth year]  Age: 74 y.o. MRN: [id number]  The patient is here for annual preventive  wellness examination and management of other chronic and acute problems.  This visit occurred during the SARS-CoV-2 public health emergency.  Safety protocols were in place, including screening questions prior to the visit, additional usage of staff PPE, and extensive cleaning of exam room while observing appropriate contact time as indicated for disinfecting solutions.    Patient has received both doses of the available COVID 19 vaccine without complications.  Patient continues to mask when outside of the home except when walking in yard or at safe distances from others .  Patient denies any change in mood or development of unhealthy behaviors resuting from the pandemic's restriction of activities and socialization.     The risk factors are reflected in the social history.  The roster of all physicians providing medical care to patient - is listed in the Snapshot section of the chart.  Activities of daily living:  The patient is 100% independent in all ADLs: dressing, toileting, feeding as well as independent mobility  Home safety : The patient has smoke detectors in the home. They wear seatbelts.  There are no firearms at home. There is no violence in the home.   There is no risks for hepatitis, STDs or HIV. There is no   history of blood transfusion. They have no travel history to infectious disease endemic areas of the world.  The patient has seen their dentist in the last six month. They have seen their eye doctor in the last year. They admit to slight hearing difficulty with regard to whispered voices and some television programs.  They have deferred audiologic testing in the last year.  They do not  have excessive sun exposure. Discussed the need for sun protection: hats, long sleeves and use of sunscreen if there is significant sun exposure.   Diet: the importance  of a healthy diet is discussed. They do have a Mediterranean healthy diet.  The benefits of regular aerobic exercise were discussed. Richard Clayton exercises regularly and has lost 5 lbs unintentionally over the last 1.5 years. Bowels are moving regularly    Depression screen: there are no signs or vegative symptoms of depression- irritability, change in appetite, anhedonia, sadness/tearfullness.  Cognitive assessment: the patient manages all their financial and personal affairs and is actively engaged. They could relate day,date,year and events; recalled 2/3 objects at 3 minutes; performed clock-face test normally.  The following portions of the patient's history were reviewed and updated as appropriate: allergies, current medications, past family history, past medical history,  past surgical history, past social history  and problem list.  Visual acuity was not assessed per patient preference since she has regular follow up with her ophthalmologist. Hearing and body mass index were assessed and reviewed.   During the course of the visit the patient was educated and counseled about appropriate screening and preventive services including : fall prevention , diabetes screening, nutrition counseling, colorectal cancer screening, and recommended immunizations.    CC: The primary encounter diagnosis was Hyperlipidemia with target LDL less than 160. Diagnoses of Prostate cancer screening, Vitamin D deficiency, History of colonic polyps, Encounter for preventive health examination, and Aortic root dilatation (HCC) were also pertinent to this visit.  No new issues  Taking 3500 ius of vitamin d daily  History Richard Clayton has a past medical history of Hemorrhoids and Hyperlipidemia.   Richard Clayton has a past surgical  history that includes Tonsilectomy/adenoidectomy with myringotomy (1950); Eye surgery; Cataract extraction w/PHACO (Left, 04/15/2015); Colonoscopy (2011); Tonsillectomy; and Colonoscopy with propofol (N/A,  09/04/2015).   His family history includes Cancer (age of onset: 29) in his father; Cancer (age of onset: 20) in his sister; Hyperlipidemia in his mother.Richard Clayton reports that Richard Clayton quit smoking about 51 years ago. His smoking use included cigarettes. Richard Clayton smoked 1.00 pack per day. Richard Clayton has never used smokeless tobacco. Richard Clayton reports current alcohol use. Richard Clayton reports that Richard Clayton does not use drugs.  Outpatient Medications Prior to Visit  Medication Sig Dispense Refill  . Ascorbic Acid (VITAMIN C PO) Take 1,000 mg by mouth in the morning and at bedtime.    . Cholecalciferol (VITAMIN D3 PO) Take 4,000 Units by mouth daily.    . Lecithin 1200 MG CAPS Take by mouth daily.    . Misc Natural Products (GLUCOSAMINE CHOND MSM FORMULA PO) Take 1 tablet by mouth daily.    [name] VITAMIN E PO Take 800 Units by mouth in the morning and at bedtime.     No facility-administered medications prior to visit.    Review of Systems   Patient denies headache, fevers, malaise, unintentional weight loss, skin rash, eye pain, sinus congestion and sinus pain, sore throat, dysphagia,  hemoptysis , cough, dyspnea, wheezing, chest pain, palpitations, orthopnea, edema, abdominal pain, nausea, melena, diarrhea, constipation, flank pain, dysuria, hematuria, urinary  Frequency, nocturia, numbness, tingling, seizures,  Focal weakness, Loss of consciousness,  Tremor, insomnia, depression, anxiety, and suicidal ideation.      Objective:  BP 116/84   Pulse 67   Temp 97.7 F (36.5 C) (Temporal)   Ht 5\' 11"  (1.803 m)   Wt 168 lb 3.2 oz (76.3 kg)   SpO2 98%   BMI 23.46 kg/m   Physical Exam  General appearance: alert, cooperative and appears stated age Ears: normal TM's and external ear canals both ears Throat: lips, mucosa, and tongue normal; teeth and gums normal Neck: no adenopathy, no carotid bruit, supple, symmetrical, trachea midline and thyroid not enlarged, symmetric, no tenderness/mass/nodules Back: symmetric, no curvature. ROM normal.  No CVA tenderness. Lungs: clear to auscultation bilaterally Heart: regular rate and rhythm, S1, S2 normal, no murmur, click, rub or gallop Abdomen: soft, non-tender; bowel sounds normal; no masses,  no organomegaly Pulses: 2+ and symmetric Skin: Skin color, texture, turgor normal. No rashes or lesions Lymph nodes: Cervical, supraclavicular, and axillary nodes normal.   Assessment & Plan:   Problem List Items Addressed This Visit      Unprioritized   Hyperlipidemia with target LDL less than 160 - Primary    Richard Clayton has stopped taking Crestor due to statin intolerance and his noninvasive testing with coronary CT showed a very low calcium score of 5.9 (22nc percentile)      Relevant Orders   Lipid panel   Comprehensive metabolic panel   Encounter for preventive health examination    age appropriate education and counseling updated, referrals for preventative services and immunizations addressed, dietary and smoking counseling addressed, most recent labs reviewed.  I have personally reviewed and have noted:  1) the patient's medical and social history 2) The pt's use of alcohol, tobacco, and illicit drugs 3) The patient's current medications and supplements 4) Functional ability including ADL's, fall risk, home safety risk, hearing and visual impairment 5) Diet and physical activities 6) Evidence for depression or mood disorder 7) The patient's height, weight, and BMI have been recorded in the chart  I have made  referrals, and provided counseling and education based on review of the above      History of colonic polyps   Relevant Orders   Ambulatory referral to General Surgery   Prostate cancer screening   Relevant Orders   PSA, Medicare   Aortic root dilatation (Richard Clayton)    Richard Clayton has decided to forgo any additional scanning since his aortic root dilatation has not increased in 16 years       Other Visit Diagnoses    Vitamin D deficiency       Relevant Orders   VITAMIN D 25 Hydroxy  (Vit-D Deficiency, Fractures)      I am having Richard Clayton maintain his Misc Natural Products (GLUCOSAMINE CHOND MSM FORMULA PO), Lecithin, VITAMIN E PO, Cholecalciferol (VITAMIN D3 PO), and Ascorbic Acid (VITAMIN C PO).  No orders of the defined types were placed in this encounter.   There are no discontinued medications.  Follow-up: No follow-ups on file.   Crecencio Mc, MD

## 2020-03-05 ENCOUNTER — Other Ambulatory Visit: Payer: Self-pay | Admitting: Internal Medicine

## 2020-03-05 DIAGNOSIS — R748 Abnormal levels of other serum enzymes: Secondary | ICD-10-CM

## 2020-03-05 LAB — LIPID PANEL
Cholesterol: 240 mg/dL — ABNORMAL HIGH (ref 0–200)
HDL: 56.1 mg/dL (ref >39.00)
LDL Cholesterol: 169 mg/dL — ABNORMAL HIGH (ref 0–99)
NonHDL: 183.77
Total CHOL/HDL Ratio: 4
Triglycerides: 73 mg/dL (ref 0.0–149.0)
VLDL: 14.6 mg/dL (ref 0.0–40.0)

## 2020-03-05 LAB — COMPREHENSIVE METABOLIC PANEL
ALT: 17 U/L (ref 0–53)
AST: 21 U/L (ref 0–37)
Albumin: 4.4 g/dL (ref 3.5–5.2)
Alkaline Phosphatase: 123 U/L — ABNORMAL HIGH (ref 39–117)
BUN: 11 mg/dL (ref 6–23)
CO2: 31 mEq/L (ref 19–32)
Calcium: 9.4 mg/dL (ref 8.4–10.5)
Chloride: 101 mEq/L (ref 96–112)
Creatinine, Ser: 0.87 mg/dL (ref 0.40–1.50)
GFR: 85.61 mL/min (ref >60.00)
Glucose, Bld: 58 mg/dL — ABNORMAL LOW (ref 70–99)
Potassium: 4.2 mEq/L (ref 3.5–5.1)
Sodium: 138 mEq/L (ref 135–145)
Total Bilirubin: 0.8 mg/dL (ref 0.2–1.2)
Total Protein: 6.9 g/dL (ref 6.0–8.3)

## 2020-03-05 LAB — PSA, MEDICARE: PSA: 1.59 ng/ml (ref 0.10–4.00)

## 2020-03-05 LAB — VITAMIN D 25 HYDROXY (VIT D DEFICIENCY, FRACTURES): VITD: 38.28 ng/mL (ref 30.00–100.00)

## 2020-04-04 ENCOUNTER — Other Ambulatory Visit (INDEPENDENT_AMBULATORY_CARE_PROVIDER_SITE_OTHER): Payer: Medicare PPO

## 2020-04-04 ENCOUNTER — Other Ambulatory Visit: Payer: Self-pay

## 2020-04-04 DIAGNOSIS — R748 Abnormal levels of other serum enzymes: Secondary | ICD-10-CM

## 2020-04-04 LAB — HEPATIC FUNCTION PANEL
ALT: 17 U/L (ref 0–53)
AST: 21 U/L (ref 0–37)
Albumin: 4.2 g/dL (ref 3.5–5.2)
Alkaline Phosphatase: 118 U/L — ABNORMAL HIGH (ref 39–117)
Bilirubin, Direct: 0.2 mg/dL (ref 0.0–0.3)
Total Bilirubin: 0.8 mg/dL (ref 0.2–1.2)
Total Protein: 7.1 g/dL (ref 6.0–8.3)

## 2020-08-26 ENCOUNTER — Telehealth: Payer: Self-pay

## 2020-08-26 NOTE — Telephone Encounter (Signed)
Left message for patient to call office regarding scheduling of Colonoscopy- he will need to let us know if he wants to stay with Dr.Byrnett or we can send referral to [facility] gastroenterologist.

## 2020-09-17 DIAGNOSIS — Z86010 Personal history of colonic polyps: Secondary | ICD-10-CM | POA: Diagnosis not present

## 2020-09-18 ENCOUNTER — Other Ambulatory Visit: Payer: Self-pay | Admitting: General Surgery
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
## 2020-09-25 DIAGNOSIS — Z961 Presence of intraocular lens: Secondary | ICD-10-CM | POA: Diagnosis not present

## 2020-09-30 ENCOUNTER — Other Ambulatory Visit
Admission: RE | Admit: 2020-09-30 | Discharge: 2020-09-30 | Disposition: A | Discharge status: Home / Self Care | Payer: Medicare PPO | Source: Ambulatory Visit | Attending: General Surgery | Admitting: General Surgery

## 2020-09-30 ENCOUNTER — Other Ambulatory Visit: Payer: Self-pay

## 2020-09-30 DIAGNOSIS — Z20822 Contact with and (suspected) exposure to covid-19: Secondary | ICD-10-CM | POA: Diagnosis not present

## 2020-09-30 DIAGNOSIS — Z01812 Encounter for preprocedural laboratory examination: Secondary | ICD-10-CM | POA: Insufficient documentation

## 2020-09-30 LAB — SARS CORONAVIRUS 2 (TAT 6-24 HRS): SARS Coronavirus 2: NEGATIVE

## 2020-10-01 ENCOUNTER — Encounter: Payer: Self-pay | Admitting: General Surgery

## 2020-10-02 ENCOUNTER — Encounter: Payer: Self-pay | Admitting: General Surgery

## 2020-10-02 ENCOUNTER — Ambulatory Visit
Admission: RE | Admit: 2020-10-02 | Discharge: 2020-10-02 | Disposition: A | Discharge status: Home / Self Care | Payer: Medicare PPO | Attending: General Surgery | Admitting: General Surgery

## 2020-10-02 ENCOUNTER — Ambulatory Visit: Payer: Medicare PPO | Admitting: Anesthesiology

## 2020-10-02 ENCOUNTER — Encounter
Admission: RE | Disposition: A | Discharge status: Home / Self Care | Payer: Self-pay | Source: Home / Self Care | Attending: General Surgery

## 2020-10-02 DIAGNOSIS — Z86010 Personal history of colonic polyps: Secondary | ICD-10-CM | POA: Diagnosis not present

## 2020-10-02 DIAGNOSIS — K5730 Diverticulosis of large intestine without perforation or abscess without bleeding: Secondary | ICD-10-CM | POA: Diagnosis not present

## 2020-10-02 DIAGNOSIS — Z1211 Encounter for screening for malignant neoplasm of colon: Secondary | ICD-10-CM | POA: Diagnosis not present

## 2020-10-02 DIAGNOSIS — Z87891 Personal history of nicotine dependence: Secondary | ICD-10-CM | POA: Insufficient documentation

## 2020-10-02 DIAGNOSIS — K5790 Diverticulosis of intestine, part unspecified, without perforation or abscess without bleeding: Secondary | ICD-10-CM | POA: Diagnosis not present

## 2020-10-02 HISTORY — PX: COLONOSCOPY WITH PROPOFOL: SHX5780

## 2020-10-02 SURGERY — COLONOSCOPY WITH PROPOFOL
Anesthesia: General

## 2020-10-02 MED ORDER — PROPOFOL 10 MG/ML IV BOLUS
INTRAVENOUS | Status: DC | PRN
  Administered 2020-10-02: 80 mg via INTRAVENOUS

## 2020-10-02 MED ORDER — PROPOFOL 10 MG/ML IV BOLUS
INTRAVENOUS | Status: AC
  Filled 2020-10-02: qty 20

## 2020-10-02 MED ORDER — LIDOCAINE HCL (PF) 2 % IJ SOLN
INTRAMUSCULAR | Status: AC
  Filled 2020-10-02: qty 5

## 2020-10-02 MED ORDER — PROPOFOL 500 MG/50ML IV EMUL
INTRAVENOUS | Status: DC | PRN
  Administered 2020-10-02: 130 ug/kg/min via INTRAVENOUS

## 2020-10-02 MED ORDER — SODIUM CHLORIDE 0.9 % IV SOLN
INTRAVENOUS | Status: DC
  Administered 2020-10-02: 20 mL/h via INTRAVENOUS

## 2020-10-02 NOTE — Anesthesia Postprocedure Evaluation (Signed)
Anesthesia Post Note  Patient: Richard Clayton  Procedure(s) Performed: COLONOSCOPY WITH PROPOFOL (N/A )  Patient location during evaluation: Endoscopy Anesthesia Type: General Level of consciousness: awake and alert Pain management: pain level controlled Vital Signs Assessment: post-procedure vital signs reviewed and stable Respiratory status: spontaneous breathing, nonlabored ventilation, respiratory function stable and patient connected to nasal cannula oxygen Cardiovascular status: blood pressure returned to baseline and stable Postop Assessment: no apparent nausea or vomiting Anesthetic complications: no   No complications documented.   Last Vitals:  Vitals:   10/02/20 0938 10/02/20 0948  BP: 117/80 132/88  Pulse:    Resp:    Temp:    SpO2:      Last Pain:  Vitals:   10/02/20 0948  TempSrc:   PainSc: 0-No pain                 Cleda Mccreedy Nocole Zammit

## 2020-10-02 NOTE — H&P (Signed)
Richard Clayton [id number] Oct 19, 1946     HPI: Healthy 74 y/o male with past history colon polyps. For colonoscopy. Tolerated prep well.  Medications Prior to Admission  Medication Sig Dispense Refill Last Dose  . Ascorbic Acid (VITAMIN C PO) Take 1,000 mg by mouth in the morning and at bedtime.   Past Week at Unknown time  . Cholecalciferol (VITAMIN D3 PO) Take 4,000 Units by mouth daily.   Past Week at Unknown time  . Lecithin 1200 MG CAPS Take by mouth daily.   Past Week at Unknown time  . Misc Natural Products (GLUCOSAMINE CHOND MSM FORMULA PO) Take 1 tablet by mouth daily.   Past Week at Unknown time  . VITAMIN E PO Take 800 Units by mouth in the morning and at bedtime.   Past Week at Unknown time   No Known Allergies Past Medical History:  Diagnosis Date  . Hemorrhoids   . Hyperlipidemia    Past Surgical History:  Procedure Laterality Date  . CATARACT EXTRACTION W/PHACO Left 04/15/2015   Procedure: CATARACT EXTRACTION PHACO AND INTRAOCULAR LENS PLACEMENT (IOC);  Surgeon: Sallee Lange, MD;  Location: [facility] ORS;  Service: Ophthalmology;  Laterality: Left;  [place] 00:47 AP% 22.7 CDE 20.56 Note:suture placed in left eye at end of procedure  . COLONOSCOPY  2011   [place].  . COLONOSCOPY WITH PROPOFOL N/A 09/04/2015   Procedure: COLONOSCOPY WITH PROPOFOL;  Surgeon: Earline Mayotte, MD;  Location: [facility] ENDOSCOPY;  Service: Endoscopy;  Laterality: N/A;  . EYE SURGERY     cataract  . TONSILECTOMY/ADENOIDECTOMY WITH MYRINGOTOMY  1950  . TONSILLECTOMY     Social History   Socioeconomic History  . Marital status: Single    Spouse name: Not on file  . Number of children: Not on file  . Years of education: Not on file  . Highest education level: Not on file  Occupational History  . Not on file  Tobacco Use  . Smoking status: Former Smoker    Packs/day: 1.00    Types: Cigarettes    Quit date: 12/13/1968    Years since quitting: 51.8  . Smokeless tobacco: Never Used   Substance and Sexual Activity  . Alcohol use: Yes    Comment: wine in the evenings  . Drug use: No  . Sexual activity: Yes  Other Topics Concern  . Not on file  Social History Narrative  . Not on file   Social Determinants of Health   Financial Resource Strain:   . Difficulty of Paying Living Expenses: Not on file  Food Insecurity:   . Worried About Programme researcher, broadcasting/film/video in the Last Year: Not on file  . Ran Out of Food in the Last Year: Not on file  Transportation Needs:   . Lack of Transportation (Medical): Not on file  . Lack of Transportation (Non-Medical): Not on file  Physical Activity:   . Days of Exercise per Week: Not on file  . Minutes of Exercise per Session: Not on file  Stress:   . Feeling of Stress : Not on file  Social Connections:   . Frequency of Communication with Friends and Family: Not on file  . Frequency of Social Gatherings with Friends and Family: Not on file  . Attends Religious Services: Not on file  . Active Member of Clubs or Organizations: Not on file  . Attends Banker Meetings: Not on file  . Marital Status: Not on file  Intimate Partner Violence:   .  Fear of Current or Ex-Partner: Not on file  . Emotionally Abused: Not on file  . Physically Abused: Not on file  . Sexually Abused: Not on file   Social History   Social History Narrative  . Not on file     ROS: Negative.     PE: HEENT: Negative. Lungs: Clear. Cardio: RR.  Assessment/Plan:  Proceed with planned endoscopy.  [facility] 10/02/2020

## 2020-10-02 NOTE — Anesthesia Preprocedure Evaluation (Signed)
Anesthesia Evaluation  Patient identified by MRN, date of birth, ID band Patient awake    Reviewed: Allergy & Precautions, H&P , NPO status , Patient's Chart, lab work & pertinent test results  History of Anesthesia Complications Negative for: history of anesthetic complications  Airway Mallampati: III  TM Distance: >3 FB Neck ROM: limited    Dental  (+) Chipped   Pulmonary neg shortness of breath, former smoker,    Pulmonary exam normal        Cardiovascular Exercise Tolerance: Good (-) angina(-) Past MI Normal cardiovascular exam     Neuro/Psych negative neurological ROS  negative psych ROS   GI/Hepatic negative GI ROS, Neg liver ROS,   Endo/Other  negative endocrine ROS  Renal/GU negative Renal ROS  negative genitourinary   Musculoskeletal   Abdominal   Peds  Hematology negative hematology ROS (+)   Anesthesia Other Findings Past Medical History: No date: Hemorrhoids No date: Hyperlipidemia  Past Surgical History: 04/15/2015: CATARACT EXTRACTION W/PHACO; Left     Comment:  Procedure: CATARACT EXTRACTION PHACO AND INTRAOCULAR               LENS PLACEMENT (IOC);  Surgeon: Sallee Lange, MD;                Location: [facility] ORS;  Service: Ophthalmology;  Laterality:              Left;  [place] 00:47 AP% 22.7 CDE 20.56 Note:suture placed               in left eye at end of procedure 2011: COLONOSCOPY     Comment:  [place]. 09/04/2015: COLONOSCOPY WITH PROPOFOL; N/A     Comment:  Procedure: COLONOSCOPY WITH PROPOFOL;  Surgeon: Earline Mayotte, MD;  Location: [facility] ENDOSCOPY;  Service:               Endoscopy;  Laterality: N/A; No date: EYE SURGERY     Comment:  cataract 1950: TONSILECTOMY/ADENOIDECTOMY WITH MYRINGOTOMY No date: TONSILLECTOMY  BMI    Body Mass Index: 23.71 kg/m      Reproductive/Obstetrics negative OB ROS                             Anesthesia  Physical Anesthesia Plan  ASA: II  Anesthesia Plan: General   Post-op Pain Management:    Induction: Intravenous  PONV Risk Score and Plan: Propofol infusion and TIVA  Airway Management Planned: Natural Airway and Nasal Cannula  Additional Equipment:   Intra-op Plan:   Post-operative Plan:   Informed Consent: I have reviewed the patients History and Physical, chart, labs and discussed the procedure including the risks, benefits and alternatives for the proposed anesthesia with the patient or authorized representative who has indicated his/her understanding and acceptance.     Dental Advisory Given  Plan Discussed with: Anesthesiologist, CRNA and Surgeon  Anesthesia Plan Comments: (Patient consented for risks of anesthesia including but not limited to:  - adverse reactions to medications - risk of intubation if required - damage to eyes, teeth, lips or other oral mucosa - nerve damage due to positioning  - sore throat or hoarseness - Damage to heart, brain, nerves, lungs, other parts of body or loss of life  Patient voiced understanding.)        Anesthesia Quick Evaluation

## 2020-10-02 NOTE — Op Note (Signed)
[facility] Gastroenterology Patient Name: Richard Clayton Procedure Date: 10/02/2020 9:06 AM MRN: [id number] Account #: [id number] Date of Birth: 1946/01/30 Admit Type: Outpatient Age: 74 Room: [facility] ENDO ROOM 1 Gender: Male Note Status: Finalized Procedure:             Colonoscopy Indications:           High risk colon cancer surveillance: Personal history                         of colonic polyps Providers:             Earline Mayotte, MD Medicines:             Monitored Anesthesia Care Complications:         No immediate complications. Procedure:             Pre-Anesthesia Assessment:                        - Prior to the procedure, a History and Physical was                         performed, and patient medications, allergies and                         sensitivities were reviewed. The patient's tolerance                         of previous anesthesia was reviewed.                        - The risks and benefits of the procedure and the                         sedation options and risks were discussed with the                         patient. All questions were answered and informed                         consent was obtained.                        After obtaining informed consent, the colonoscope was                         passed under direct vision. Throughout the procedure,                         the patient's blood pressure, pulse, and oxygen                         saturations were monitored continuously. The                         Colonoscope was introduced through the anus and                         advanced to the the cecum, identified by appendiceal  orifice and ileocecal valve. The colonoscopy was                         somewhat difficult due to multiple diverticula in the                         colon. Successful completion of the procedure was                         aided by using manual pressure. The patient  tolerated                         the procedure well. The quality of the bowel                         preparation was excellent. Findings:      A few medium-mouthed diverticula were found in the sigmoid colon.      The retroflexed view of the distal rectum and anal verge was normal and       showed no anal or rectal abnormalities. Impression:            - Diverticulosis in the sigmoid colon.                        - The distal rectum and anal verge are normal on                         retroflexion view.                        - No specimens collected. Recommendation:        - Repeat colonoscopy in 5 years for surveillance. Procedure Code(s):     --- Professional ---                        [id number], Colonoscopy, flexible; diagnostic, including                         collection of specimen(s) by brushing or washing, when                         performed (separate procedure) Diagnosis Code(s):     --- Professional ---                        Z86.010, Personal history of colonic polyps                        K57.30, Diverticulosis of large intestine without                         perforation or abscess without bleeding CPT copyright 2019 American Medical Association. All rights reserved. The codes documented in this report are preliminary and upon coder review may  be revised to meet current compliance requirements. Earline Mayotte, MD 10/02/2020 9:26:31 AM This report has been signed electronically. Number of Addenda: 0 Note Initiated On: 10/02/2020 9:06 AM Scope Withdrawal Time: 0 hours 6 minutes 3 seconds  Total Procedure Duration: 0 hours 13 minutes 29 seconds  Estimated Blood Loss:  Estimated blood loss:  none.      [facility]

## 2020-10-02 NOTE — Transfer of Care (Signed)
Immediate Anesthesia Transfer of Care Note  Patient: Richard Clayton  Procedure(s) Performed: COLONOSCOPY WITH PROPOFOL (N/A )  Patient Location: Endoscopy Unit  Anesthesia Type:General  Level of Consciousness: drowsy and patient cooperative  Airway & Oxygen Therapy: Patient Spontanous Breathing  Post-op Assessment: Report given to RN and Post -op Vital signs reviewed and stable  Post vital signs: Reviewed and stable  Last Vitals:  Vitals Value Taken Time  BP 93/61 10/02/20 0931  Temp 36.7 C 10/02/20 0928  Pulse 78 10/02/20 0933  Resp 23 10/02/20 0933  SpO2 97 % 10/02/20 0933  Vitals shown include unvalidated device data.  Last Pain:  Vitals:   10/02/20 0928  TempSrc: Temporal  PainSc: 0-No pain         Complications: No complications documented.

## 2020-10-03 ENCOUNTER — Encounter: Payer: Self-pay | Admitting: General Surgery

## 2020-12-05 DIAGNOSIS — Z20828 Contact with and (suspected) exposure to other viral communicable diseases: Secondary | ICD-10-CM | POA: Diagnosis not present

## 2020-12-09 DIAGNOSIS — Z20828 Contact with and (suspected) exposure to other viral communicable diseases: Secondary | ICD-10-CM | POA: Diagnosis not present

## 2021-01-03 ENCOUNTER — Ambulatory Visit: Payer: Medicare PPO

## 2021-03-17 ENCOUNTER — Telehealth: Payer: Self-pay | Admitting: Internal Medicine

## 2021-03-17 NOTE — Telephone Encounter (Signed)
Left message for patient to call back and schedule Medicare Annual Wellness Visit (AWV)   Please schedule on the [facility] Nurse Health Advisor template  Last AWV 08/21/15; please schedule at anytime   This should be a 45 minute visit

## 2021-04-11 ENCOUNTER — Ambulatory Visit: Payer: Medicare PPO | Admitting: Internal Medicine

## 2021-04-14 ENCOUNTER — Encounter: Payer: Self-pay | Admitting: Internal Medicine

## 2021-04-14 ENCOUNTER — Ambulatory Visit: Payer: Medicare PPO | Admitting: Internal Medicine

## 2021-04-14 ENCOUNTER — Other Ambulatory Visit: Payer: Self-pay

## 2021-04-14 VITALS — BP 112/78 | HR 61 | Temp 98.1°F | Ht 71.0 in | Wt 168.2 lb

## 2021-04-14 DIAGNOSIS — G5601 Carpal tunnel syndrome, right upper limb: Secondary | ICD-10-CM | POA: Diagnosis not present

## 2021-04-14 DIAGNOSIS — T466X5A Adverse effect of antihyperlipidemic and antiarteriosclerotic drugs, initial encounter: Secondary | ICD-10-CM

## 2021-04-14 DIAGNOSIS — R5383 Other fatigue: Secondary | ICD-10-CM | POA: Diagnosis not present

## 2021-04-14 DIAGNOSIS — Z0000 Encounter for general adult medical examination without abnormal findings: Secondary | ICD-10-CM | POA: Diagnosis not present

## 2021-04-14 DIAGNOSIS — Z9229 Personal history of other drug therapy: Secondary | ICD-10-CM | POA: Diagnosis not present

## 2021-04-14 DIAGNOSIS — M791 Myalgia, unspecified site: Secondary | ICD-10-CM

## 2021-04-14 DIAGNOSIS — E785 Hyperlipidemia, unspecified: Secondary | ICD-10-CM

## 2021-04-14 DIAGNOSIS — Z125 Encounter for screening for malignant neoplasm of prostate: Secondary | ICD-10-CM | POA: Diagnosis not present

## 2021-04-14 LAB — CBC WITH DIFFERENTIAL/PLATELET
Basophils Absolute: 0.1 10*3/uL (ref 0.0–0.1)
Basophils Relative: 1.1 % (ref 0.0–3.0)
Eosinophils Absolute: 0.1 10*3/uL (ref 0.0–0.7)
Eosinophils Relative: 1.5 % (ref 0.0–5.0)
HCT: 43.5 % (ref 39.0–52.0)
Hemoglobin: 14.8 g/dL (ref 13.0–17.0)
Lymphocytes Relative: 17.3 % (ref 12.0–46.0)
Lymphs Abs: 0.8 10*3/uL (ref 0.7–4.0)
MCHC: 34 g/dL (ref 30.0–36.0)
MCV: 86.8 fl (ref 78.0–100.0)
Monocytes Absolute: 0.4 10*3/uL (ref 0.1–1.0)
Monocytes Relative: 7.5 % (ref 3.0–12.0)
Neutro Abs: 3.5 10*3/uL (ref 1.4–7.7)
Neutrophils Relative %: 72.6 % (ref 43.0–77.0)
Platelets: 186 10*3/uL (ref 150.0–400.0)
RBC: 5.01 Mil/uL (ref 4.22–5.81)
RDW: 13.5 % (ref 11.5–15.5)
WBC: 4.8 10*3/uL (ref 4.0–10.5)

## 2021-04-14 LAB — COMPREHENSIVE METABOLIC PANEL
ALT: 18 U/L (ref 0–53)
AST: 19 U/L (ref 0–37)
Albumin: 4.4 g/dL (ref 3.5–5.2)
Alkaline Phosphatase: 120 U/L — ABNORMAL HIGH (ref 39–117)
BUN: 14 mg/dL (ref 6–23)
CO2: 29 mEq/L (ref 19–32)
Calcium: 9.6 mg/dL (ref 8.4–10.5)
Chloride: 101 mEq/L (ref 96–112)
Creatinine, Ser: 0.94 mg/dL (ref 0.40–1.50)
GFR: 79.11 mL/min (ref >60.00)
Glucose, Bld: 94 mg/dL (ref 70–99)
Potassium: 4.5 mEq/L (ref 3.5–5.1)
Sodium: 138 mEq/L (ref 135–145)
Total Bilirubin: 0.7 mg/dL (ref 0.2–1.2)
Total Protein: 6.8 g/dL (ref 6.0–8.3)

## 2021-04-14 LAB — LIPID PANEL
Cholesterol: 241 mg/dL — ABNORMAL HIGH (ref 0–200)
HDL: 56.6 mg/dL (ref >39.00)
LDL Cholesterol: 172 mg/dL — ABNORMAL HIGH (ref 0–99)
NonHDL: 184.56
Total CHOL/HDL Ratio: 4
Triglycerides: 62 mg/dL (ref 0.0–149.0)
VLDL: 12.4 mg/dL (ref 0.0–40.0)

## 2021-04-14 LAB — TSH: TSH: 1.57 u[IU]/mL (ref 0.35–4.50)

## 2021-04-14 NOTE — Patient Instructions (Signed)
Preventing Carpal Tunnel Syndrome  Carpal tunnel syndrome is a condition that causes pain, numbness, and weakness in the wrist, hand, and fingers. The carpal tunnel is a narrow, rigid space in the wrist. Tendons and the median nerve pass through the carpal tunnel. The median nerve is the main nerve in the hand. The median nerve gives sensation or feeling to the thumb, the muscles at the base of the thumb, and the first three fingers. Carpal tunnel syndrome happens when the median nerve gets squeezed in the area where it passes through the carpal tunnel. In some cases, it may not be possible to prevent carpal tunnel syndrome. However, you can take steps to relieve pressure on your wrist and reduce your risk of developing this condition. How can carpal tunnel syndrome affect me? Carpal tunnel syndrome can affect your ability to do jobs or activities that involve hand, wrist, and finger action. It can cause symptoms such as:  Pain in the wrist, hand, and fingers.  Burning, tingling, or numbness in the affected area.  A weak feeling in your hands. You may have trouble grabbing and holding items. Symptoms may get worse over time. For some people, symptoms get worse at night. What can increase my risk? The following factors may make you more likely to develop this condition:  Having a job that requires you to repeatedly or forcefully bend or move your wrist, or a job that requires you to use tools that vibrate. This may include jobs that involve using computers, working on an [employer], or working with [employer] such as Pension scheme manager.  Being a woman.  Having a family history of the condition.  Having certain conditions, such as: ? Diabetes. ? Pregnancy. ? Obesity. ? Thyroid disease. ? Rheumatoid arthritis. What actions can I take to help prevent carpal tunnel syndrome?  Avoid making repetitive or forceful hand and wrist motions that cause your wrist to bend or get stiff or  painful.  Take frequent breaks, about every 30 minutes, if you use your hands and wrists for many hours at a time.  Avoid sitting for long stretches of time. Try to get up and move every 30 minutes. Stretch your hands and fingers often to increase blood flow and relieve tension.  Keep your wrists in the natural position when using a computer keyboard or mouse. Do not bend your wrists downward or sideways. Arms and shoulders should be relaxed with elbows at your sides.  If you use your hands and wrists for many hours at work, make changes to your work space to ease pressure on your wrists. You may want to use: ? A padded wrist rest for computer work. Use this to lightly rest your wrist and hands when you are not actively keying. ? A keyboard at a height in which your wrists are straight when typing. You may need to flatten the keyboard or even tilt it away from you. ? Hand tools with padded handles or work gloves with padding to reduce vibrations.  Talk to your health provider about wearing a wrist brace or support. This will not prevent carpal tunnel syndrome but it may keep it from getting worse. A wrist brace may help reduce bending and stress.  Closely manage any medical conditions you have that can put you at risk for carpal tunnel syndrome. Have your blood sugar checked to make sure you are not developing diabetes. If you have diabetes, work with your health care provider to keep your blood sugar  under control.  Physical activity and exercise may help with this condition. Some people find yoga or aerobic exercise helpful.      Where to find more information  [employer] of Neurological Disorders and Stroke: BasicFM.no  American Academy of Family Physicians: Hydrologist.org Contact a health care provider if:  You have numbness or tingling in your wrist, hand, or fingers.  You have pain or a burning sensation in your wrist, hand, or fingers.  Pain, tingling, or burning  wakes you up at night.  Your hand becomes weak and clumsy.  You frequently drop objects.  You are unable to use your wrists and hands without pain. Summary  Carpal tunnel syndrome is a condition that causes pain, numbness, and weakness in the wrist, hand, and fingers.  You can take steps to relieve pressure on your wrist and reduce your risk of developing this condition.  Avoid making repetitive hand and wrist motions that cause your wrist to get stiff or painful.  If you use your hands and wrists for many hours at work, you may want to make changes to your work space to ease pressure on your wrists.  Take frequent breaks to stretch your hands and fingers. This information is not intended to replace advice given to you by your health care provider. Make sure you discuss any questions you have with your health care provider. Document Revised: 05/02/2020 Document Reviewed: 03/28/2020 Elsevier Patient Education  2021 [employer].

## 2021-04-14 NOTE — Progress Notes (Signed)
Patient ID: Richard Clayton, male    DOB: 1946/07/10  Age: 75 y.o. MRN: [id number]  The patient is here for follow up and  management of other chronic and acute problems.  This visit occurred during the SARS-CoV-2 public health emergency.  Safety protocols were in place, including screening questions prior to the visit, additional usage of staff PPE, and extensive cleaning of exam room while observing appropriate contact time as indicated for disinfecting solutions.      The risk factors are reflected in the social history.  The roster of all physicians providing medical care to patient - is listed in the Snapshot section of the chart.  Activities of daily living:  The patient is 100% independent in all ADLs: dressing, toileting, feeding as well as independent mobility  Home safety : The patient has smoke detectors in the home. They wear seatbelts.  There are no firearms at home. There is no violence in the home.   There is no risks for hepatitis, STDs or HIV. There is no   history of blood transfusion. They have no travel history to infectious disease endemic areas of the world.  The patient has seen their dentist in the last six month. They have seen their eye doctor in the last year. He denies  hearing difficulty with regard to whispered voices and some television programs.  They have deferred audiologic testing in the last year.  They do not  have excessive sun exposure. Discussed the need for sun protection: hats, long sleeves and use of sunscreen if there is significant sun exposure.   Diet: the importance of a healthy diet is discussed. They do have a healthy diet. He has reduced his animal fat intake to one meal per day.   The benefits of regular aerobic exercise were discussed. He exercises with weights and aerobic exercises 5 days  per week ,  60 minutes.   Depression screen: there are no signs or vegative symptoms of depression- irritability, change in appetite, anhedonia,  sadness/tearfullness.  Cognitive assessment: the patient manages all their financial and personal affairs and is actively engaged. They could relate day,date,year and events; recalled 2/3 objects at 3 minutes; performed clock-face test normally.  The following portions of the patient's history were reviewed and updated as appropriate: allergies, current medications, past family history, past medical history,  past surgical history, past social history  and problem list.  Visual acuity was not assessed per patient preference since she has regular follow up with her ophthalmologist. Hearing and body mass index were assessed and reviewed.   During the course of the visit the patient was educated and counseled about appropriate screening and preventive services including : fall prevention , diabetes screening, nutrition counseling, colorectal cancer screening, and recommended immunizations.    CC: The primary encounter diagnosis was COVID-19 vaccine series completed. Diagnoses of Hyperlipidemia with target LDL less than 160, Fatigue, unspecified type, Carpal tunnel syndrome of right wrist, Encounter for preventive health examination, Myalgia due to statin, and Prostate cancer screening were also pertinent to this visit.   1)  Recent onset of Symptoms of pain in right hand.   Started with , numbness and burning of middle finger and ring finger at times has radiated up to elbow.  Primarily the  Right hand. Aggravated by use of mouse for more than 2 hours daily  and computer keyboard.  .  Discussed referral to orthopedics  But does not want surgery or medications.  Only wants natural methods via  PT.   From [facility] At [facility]. Using a wrist stabilizer,  Helping.    2) requesting [facility] test for quantification of the COVID antibody level.    History Wynter has a past medical history of Hemorrhoids and Hyperlipidemia.   He has a past surgical history that includes Tonsilectomy/adenoidectomy with  myringotomy (1950); Eye surgery; Cataract extraction w/PHACO (Left, 04/15/2015); Colonoscopy (2011); Tonsillectomy; Colonoscopy with propofol (N/A, 09/04/2015); and Colonoscopy with propofol (N/A, 10/02/2020).   His family history includes Cancer (age of onset: 21) in his father; Cancer (age of onset: 29) in his sister; Hyperlipidemia in his mother.He reports that he quit smoking about 52 years ago. His smoking use included cigarettes. He smoked 1.00 pack per day. He has never used smokeless tobacco. He reports current alcohol use. He reports that he does not use drugs.  Outpatient Medications Prior to Visit  Medication Sig Dispense Refill  . Ascorbic Acid (VITAMIN C PO) Take 1,000 mg by mouth in the morning and at bedtime.    . Cholecalciferol (VITAMIN D3 PO) Take 4,000 Units by mouth daily.    . Lecithin 1200 MG CAPS Take by mouth daily.    [name] VITAMIN E PO Take 800 Units by mouth in the morning and at bedtime.    . Misc Natural Products (GLUCOSAMINE CHOND MSM FORMULA PO) Take 1 tablet by mouth daily. (Patient not taking: Reported on 04/14/2021)     No facility-administered medications prior to visit.    Review of Systems   Patient denies headache, fevers, malaise, unintentional weight loss, skin rash, eye pain, sinus congestion and sinus pain, sore throat, dysphagia,  hemoptysis , cough, dyspnea, wheezing, chest pain, palpitations, orthopnea, edema, abdominal pain, nausea, melena, diarrhea, constipation, flank pain, dysuria, hematuria, urinary  Frequency, nocturia, numbness, tingling, seizures,  Focal weakness, Loss of consciousness,  Tremor, insomnia, depression, anxiety, and suicidal ideation.      Objective:  BP 112/78 (BP Location: Left Arm, Patient Position: Sitting, Cuff Size: Normal)   Pulse 61   Temp 98.1 F (36.7 C) (Oral)   Ht 5\' 11"  (1.803 m)   Wt 168 lb 3.2 oz (76.3 kg)   SpO2 97%   BMI 23.46 kg/m   Physical Exam  General appearance: alert, cooperative and appears stated  age Ears: normal TM's and external ear canals both ears Throat: lips, mucosa, and tongue normal; teeth and gums normal Neck: no adenopathy, no carotid bruit, supple, symmetrical, trachea midline and thyroid not enlarged, symmetric, no tenderness/mass/nodules Back: symmetric, no curvature. ROM normal. No CVA tenderness. Lungs: clear to auscultation bilaterally Heart: regular rate and rhythm, S1, S2 normal, no murmur, click, rub or gallop Abdomen: soft, non-tender; bowel sounds normal; no masses,  no organomegaly Pulses: 2+ and symmetric Ext:  Positive Tinel's sign ,  Right hand  Skin: Skin color, texture, turgor normal. No rashes or lesions Lymph nodes: Cervical, supraclavicular, and axillary nodes normal.  Assessment & Plan:   Problem List Items Addressed This Visit      Unprioritized   Carpal tunnel syndrome of right wrist    Based on symptoms , history and exam.  Declines orthopedic/neurosurgical evaluation currently as he wants to manage with splint,  PT and behavior modification       Encounter for preventive health examination    age appropriate education and counseling updated, referrals for preventative services and immunizations addressed, dietary and smoking counseling addressed, most recent labs reviewed.  I have personally reviewed and have noted:  1) the patient's medical and  social history 2) The pt's use of alcohol, tobacco, and illicit drugs 3) The patient's current medications and supplements 4) Functional ability including ADL's, fall risk, home safety risk, hearing and visual impairment 5) Diet and physical activities 6) Evidence for depression or mood disorder 7) The patient's height, weight, and BMI have been recorded in the chart  I have made referrals, and provided counseling and education based on review of the above      Hyperlipidemia with target LDL less than 160    He has stopped taking Crestor due to statin intolerance and his noninvasive testing  with coronary CT showed a very low calcium score of 5.9 (22nc percentile).  He has reduced the animal protein/fat in his diet by 2/3 with no significant change in LDL or HDL.   Lab Results  Component Value Date   CHOL 241 (H) 04/14/2021   HDL 56.60 04/14/2021   LDLCALC 172 (H) 04/14/2021   TRIG 62.0 04/14/2021   CHOLHDL 4 04/14/2021         Relevant Orders   Lipid panel (Completed)   Myalgia due to statin    Trial of low dose Crestor was not tolerated due to myalgias which resolved after suspension.  Cardiac CT was done and his score was very low.  No indication for treatment.        Prostate cancer screening    Screening has stopped due to age and history of normal PSAs  Lab Results  Component Value Date   PSA 1.59 03/04/2020   PSA 2.25 04/28/2018   PSA 1.59 07/13/2014          Other Visit Diagnoses    COVID-19 vaccine series completed    -  Primary   Relevant Orders   SARS-CoV-2 Semi-Quantitative Total Antibody, Spike   Fatigue, unspecified type       Relevant Orders   Comprehensive metabolic panel (Completed)   CBC with Differential/Platelet (Completed)   TSH (Completed)      I am having Karmine Csaszar "Sal" maintain his Misc Natural Products (GLUCOSAMINE CHOND MSM FORMULA PO), Lecithin, VITAMIN E PO, Cholecalciferol (VITAMIN D3 PO), and Ascorbic Acid (VITAMIN C PO).  No orders of the defined types were placed in this encounter.   There are no discontinued medications.  Follow-up: No follow-ups on file.   [name] Shams, MD

## 2021-04-15 DIAGNOSIS — M791 Myalgia, unspecified site: Secondary | ICD-10-CM | POA: Insufficient documentation

## 2021-04-15 DIAGNOSIS — T466X5A Adverse effect of antihyperlipidemic and antiarteriosclerotic drugs, initial encounter: Secondary | ICD-10-CM | POA: Insufficient documentation

## 2021-04-15 DIAGNOSIS — G5601 Carpal tunnel syndrome, right upper limb: Secondary | ICD-10-CM | POA: Insufficient documentation

## 2021-04-15 NOTE — Assessment & Plan Note (Signed)
He has stopped taking Crestor due to statin intolerance and his noninvasive testing with coronary CT showed a very low calcium score of 5.9 (22nc percentile).  He has reduced the animal protein/fat in his diet by 2/3 with no significant change in LDL or HDL.   Lab Results  Component Value Date   CHOL 241 (H) 04/14/2021   HDL 56.60 04/14/2021   LDLCALC 172 (H) 04/14/2021   TRIG 62.0 04/14/2021   CHOLHDL 4 04/14/2021

## 2021-04-15 NOTE — Assessment & Plan Note (Signed)

## 2021-04-15 NOTE — Assessment & Plan Note (Signed)
Trial of low dose Crestor was not tolerated due to myalgias which resolved after suspension.  Cardiac CT was done and his score was very low.  No indication for treatment.

## 2021-04-15 NOTE — Assessment & Plan Note (Signed)
Screening has stopped due to age and history of normal PSAs  Lab Results  Component Value Date   PSA 1.59 03/04/2020   PSA 2.25 04/28/2018   PSA 1.59 07/13/2014

## 2021-04-15 NOTE — Assessment & Plan Note (Signed)
Based on symptoms , history and exam.  Declines orthopedic/neurosurgical evaluation currently as he wants to manage with splint,  PT and behavior modification

## 2021-04-16 ENCOUNTER — Encounter: Payer: Self-pay | Admitting: Internal Medicine

## 2021-04-18 DIAGNOSIS — M25532 Pain in left wrist: Secondary | ICD-10-CM | POA: Diagnosis not present

## 2021-04-18 DIAGNOSIS — G5603 Carpal tunnel syndrome, bilateral upper limbs: Secondary | ICD-10-CM | POA: Diagnosis not present

## 2021-04-18 DIAGNOSIS — M25531 Pain in right wrist: Secondary | ICD-10-CM | POA: Diagnosis not present

## 2021-04-18 DIAGNOSIS — M6281 Muscle weakness (generalized): Secondary | ICD-10-CM | POA: Diagnosis not present

## 2021-04-18 LAB — SARS-COV-2 SEMI-QUANTITATIVE TOTAL ANTIBODY, SPIKE: SARS COV2 AB, Total Spike Semi QN: >2500 U/mL — ABNORMAL HIGH (ref <0.8)

## 2021-04-30 DIAGNOSIS — R748 Abnormal levels of other serum enzymes: Secondary | ICD-10-CM

## 2021-05-12 DIAGNOSIS — G5603 Carpal tunnel syndrome, bilateral upper limbs: Secondary | ICD-10-CM | POA: Diagnosis not present

## 2021-05-12 DIAGNOSIS — M25531 Pain in right wrist: Secondary | ICD-10-CM | POA: Diagnosis not present

## 2021-05-12 DIAGNOSIS — M6281 Muscle weakness (generalized): Secondary | ICD-10-CM | POA: Diagnosis not present

## 2021-05-12 DIAGNOSIS — M25532 Pain in left wrist: Secondary | ICD-10-CM | POA: Diagnosis not present

## 2021-05-14 DIAGNOSIS — M25532 Pain in left wrist: Secondary | ICD-10-CM | POA: Diagnosis not present

## 2021-05-14 DIAGNOSIS — M6281 Muscle weakness (generalized): Secondary | ICD-10-CM | POA: Diagnosis not present

## 2021-05-14 DIAGNOSIS — M25531 Pain in right wrist: Secondary | ICD-10-CM | POA: Diagnosis not present

## 2021-05-14 DIAGNOSIS — G5603 Carpal tunnel syndrome, bilateral upper limbs: Secondary | ICD-10-CM | POA: Diagnosis not present

## 2021-05-19 ENCOUNTER — Ambulatory Visit
Admission: RE | Admit: 2021-05-19 | Discharge: 2021-05-19 | Disposition: A | Discharge status: Home / Self Care | Payer: Medicare PPO | Source: Ambulatory Visit | Attending: Internal Medicine | Admitting: Internal Medicine

## 2021-05-19 ENCOUNTER — Other Ambulatory Visit: Payer: Self-pay

## 2021-05-19 DIAGNOSIS — R945 Abnormal results of liver function studies: Secondary | ICD-10-CM | POA: Diagnosis not present

## 2021-05-19 DIAGNOSIS — R748 Abnormal levels of other serum enzymes: Secondary | ICD-10-CM

## 2021-05-21 DIAGNOSIS — G5603 Carpal tunnel syndrome, bilateral upper limbs: Secondary | ICD-10-CM | POA: Diagnosis not present

## 2021-05-21 DIAGNOSIS — M25532 Pain in left wrist: Secondary | ICD-10-CM | POA: Diagnosis not present

## 2021-05-21 DIAGNOSIS — M6281 Muscle weakness (generalized): Secondary | ICD-10-CM | POA: Diagnosis not present

## 2021-05-21 DIAGNOSIS — M25531 Pain in right wrist: Secondary | ICD-10-CM | POA: Diagnosis not present

## 2021-05-24 DIAGNOSIS — M25532 Pain in left wrist: Secondary | ICD-10-CM | POA: Diagnosis not present

## 2021-05-24 DIAGNOSIS — G5603 Carpal tunnel syndrome, bilateral upper limbs: Secondary | ICD-10-CM | POA: Diagnosis not present

## 2021-05-24 DIAGNOSIS — M6281 Muscle weakness (generalized): Secondary | ICD-10-CM | POA: Diagnosis not present

## 2021-05-24 DIAGNOSIS — M25531 Pain in right wrist: Secondary | ICD-10-CM | POA: Diagnosis not present

## 2021-05-26 DIAGNOSIS — M25532 Pain in left wrist: Secondary | ICD-10-CM | POA: Diagnosis not present

## 2021-05-26 DIAGNOSIS — M25531 Pain in right wrist: Secondary | ICD-10-CM | POA: Diagnosis not present

## 2021-05-26 DIAGNOSIS — M6281 Muscle weakness (generalized): Secondary | ICD-10-CM | POA: Diagnosis not present

## 2021-05-26 DIAGNOSIS — G5603 Carpal tunnel syndrome, bilateral upper limbs: Secondary | ICD-10-CM | POA: Diagnosis not present

## 2021-05-28 DIAGNOSIS — M25532 Pain in left wrist: Secondary | ICD-10-CM | POA: Diagnosis not present

## 2021-05-28 DIAGNOSIS — M6281 Muscle weakness (generalized): Secondary | ICD-10-CM | POA: Diagnosis not present

## 2021-05-28 DIAGNOSIS — G5603 Carpal tunnel syndrome, bilateral upper limbs: Secondary | ICD-10-CM | POA: Diagnosis not present

## 2021-05-28 DIAGNOSIS — M25531 Pain in right wrist: Secondary | ICD-10-CM | POA: Diagnosis not present

## 2021-06-02 DIAGNOSIS — M25532 Pain in left wrist: Secondary | ICD-10-CM | POA: Diagnosis not present

## 2021-06-02 DIAGNOSIS — G5603 Carpal tunnel syndrome, bilateral upper limbs: Secondary | ICD-10-CM | POA: Diagnosis not present

## 2021-06-02 DIAGNOSIS — M6281 Muscle weakness (generalized): Secondary | ICD-10-CM | POA: Diagnosis not present

## 2021-06-02 DIAGNOSIS — M25531 Pain in right wrist: Secondary | ICD-10-CM | POA: Diagnosis not present

## 2021-06-04 DIAGNOSIS — M25532 Pain in left wrist: Secondary | ICD-10-CM | POA: Diagnosis not present

## 2021-06-04 DIAGNOSIS — M6281 Muscle weakness (generalized): Secondary | ICD-10-CM | POA: Diagnosis not present

## 2021-06-04 DIAGNOSIS — G5603 Carpal tunnel syndrome, bilateral upper limbs: Secondary | ICD-10-CM | POA: Diagnosis not present

## 2021-06-04 DIAGNOSIS — M25531 Pain in right wrist: Secondary | ICD-10-CM | POA: Diagnosis not present

## 2021-06-12 DIAGNOSIS — M25531 Pain in right wrist: Secondary | ICD-10-CM | POA: Diagnosis not present

## 2021-06-12 DIAGNOSIS — M6281 Muscle weakness (generalized): Secondary | ICD-10-CM | POA: Diagnosis not present

## 2021-06-12 DIAGNOSIS — G5603 Carpal tunnel syndrome, bilateral upper limbs: Secondary | ICD-10-CM | POA: Diagnosis not present

## 2021-06-12 DIAGNOSIS — M25532 Pain in left wrist: Secondary | ICD-10-CM | POA: Diagnosis not present

## 2021-06-18 DIAGNOSIS — M25531 Pain in right wrist: Secondary | ICD-10-CM | POA: Diagnosis not present

## 2021-06-18 DIAGNOSIS — M25532 Pain in left wrist: Secondary | ICD-10-CM | POA: Diagnosis not present

## 2021-06-18 DIAGNOSIS — G5603 Carpal tunnel syndrome, bilateral upper limbs: Secondary | ICD-10-CM | POA: Diagnosis not present

## 2021-06-18 DIAGNOSIS — M6281 Muscle weakness (generalized): Secondary | ICD-10-CM | POA: Diagnosis not present

## 2021-06-28 NOTE — Addendum Note (Signed)
Encounter addended by: Novella Olive on: 06/28/2021 5:38 PM  Actions taken: Letter saved

## 2021-09-30 DIAGNOSIS — Z0100 Encounter for examination of eyes and vision without abnormal findings: Secondary | ICD-10-CM | POA: Diagnosis not present

## 2021-09-30 DIAGNOSIS — Z961 Presence of intraocular lens: Secondary | ICD-10-CM | POA: Diagnosis not present

## 2021-10-08 ENCOUNTER — Encounter: Payer: Self-pay | Admitting: Internal Medicine

## 2021-10-08 ENCOUNTER — Other Ambulatory Visit: Payer: Self-pay

## 2021-10-08 ENCOUNTER — Ambulatory Visit: Payer: Medicare PPO | Admitting: Internal Medicine

## 2021-10-08 VITALS — BP 108/66 | HR 57 | Temp 95.4°F | Ht 71.0 in | Wt 151.8 lb

## 2021-10-08 DIAGNOSIS — E559 Vitamin D deficiency, unspecified: Secondary | ICD-10-CM | POA: Diagnosis not present

## 2021-10-08 DIAGNOSIS — Z8349 Family history of other endocrine, nutritional and metabolic diseases: Secondary | ICD-10-CM | POA: Diagnosis not present

## 2021-10-08 DIAGNOSIS — I499 Cardiac arrhythmia, unspecified: Secondary | ICD-10-CM

## 2021-10-08 DIAGNOSIS — R634 Abnormal weight loss: Secondary | ICD-10-CM

## 2021-10-08 DIAGNOSIS — K7460 Unspecified cirrhosis of liver: Secondary | ICD-10-CM

## 2021-10-08 DIAGNOSIS — E538 Deficiency of other specified B group vitamins: Secondary | ICD-10-CM | POA: Diagnosis not present

## 2021-10-08 DIAGNOSIS — E785 Hyperlipidemia, unspecified: Secondary | ICD-10-CM | POA: Diagnosis not present

## 2021-10-08 DIAGNOSIS — I77810 Thoracic aortic ectasia: Secondary | ICD-10-CM

## 2021-10-08 LAB — COMPREHENSIVE METABOLIC PANEL
ALT: 18 U/L (ref 0–53)
AST: 22 U/L (ref 0–37)
Albumin: 4.4 g/dL (ref 3.5–5.2)
Alkaline Phosphatase: 98 U/L (ref 39–117)
BUN: 20 mg/dL (ref 6–23)
CO2: 30 mEq/L (ref 19–32)
Calcium: 9.5 mg/dL (ref 8.4–10.5)
Chloride: 100 mEq/L (ref 96–112)
Creatinine, Ser: 0.98 mg/dL (ref 0.40–1.50)
GFR: 75 mL/min (ref >60.00)
Glucose, Bld: 88 mg/dL (ref 70–99)
Potassium: 4.4 mEq/L (ref 3.5–5.1)
Sodium: 136 mEq/L (ref 135–145)
Total Bilirubin: 1 mg/dL (ref 0.2–1.2)
Total Protein: 6.8 g/dL (ref 6.0–8.3)

## 2021-10-08 LAB — CBC WITH DIFFERENTIAL/PLATELET
Basophils Absolute: 0.1 10*3/uL (ref 0.0–0.1)
Basophils Relative: 1.9 % (ref 0.0–3.0)
Eosinophils Absolute: 0.1 10*3/uL (ref 0.0–0.7)
Eosinophils Relative: 1.8 % (ref 0.0–5.0)
HCT: 39.9 % (ref 39.0–52.0)
Hemoglobin: 13.4 g/dL (ref 13.0–17.0)
Lymphocytes Relative: 19.5 % (ref 12.0–46.0)
Lymphs Abs: 0.7 10*3/uL (ref 0.7–4.0)
MCHC: 33.6 g/dL (ref 30.0–36.0)
MCV: 87.8 fl (ref 78.0–100.0)
Monocytes Absolute: 0.3 10*3/uL (ref 0.1–1.0)
Monocytes Relative: 7.3 % (ref 3.0–12.0)
Neutro Abs: 2.6 10*3/uL (ref 1.4–7.7)
Neutrophils Relative %: 69.5 % (ref 43.0–77.0)
Platelets: 170 10*3/uL (ref 150.0–400.0)
RBC: 4.54 Mil/uL (ref 4.22–5.81)
RDW: 13.9 % (ref 11.5–15.5)
WBC: 3.7 10*3/uL — ABNORMAL LOW (ref 4.0–10.5)

## 2021-10-08 LAB — LIPID PANEL
Cholesterol: 284 mg/dL — ABNORMAL HIGH (ref 0–200)
HDL: 57 mg/dL (ref >39.00)
LDL Cholesterol: 214 mg/dL — ABNORMAL HIGH (ref 0–99)
NonHDL: 227.26
Total CHOL/HDL Ratio: 5
Triglycerides: 64 mg/dL (ref 0.0–149.0)
VLDL: 12.8 mg/dL (ref 0.0–40.0)

## 2021-10-08 LAB — TSH: TSH: 1.14 u[IU]/mL (ref 0.35–5.50)

## 2021-10-08 LAB — MAGNESIUM: Magnesium: 2 mg/dL (ref 1.5–2.5)

## 2021-10-08 LAB — VITAMIN B12: Vitamin B-12: 307 pg/mL (ref 211–911)

## 2021-10-08 LAB — PSA, MEDICARE: PSA: 1.53 ng/ml (ref 0.10–4.00)

## 2021-10-08 LAB — VITAMIN D 25 HYDROXY (VIT D DEFICIENCY, FRACTURES): VITD: 44.78 ng/mL (ref 30.00–100.00)

## 2021-10-08 LAB — HEMOGLOBIN A1C: Hgb A1c MFr Bld: 5.7 % (ref 4.6–6.5)

## 2021-10-08 NOTE — Assessment & Plan Note (Signed)
He has deferred  additional scanning since his aortic root dilatation has not increased in 16 years

## 2021-10-08 NOTE — Assessment & Plan Note (Addendum)
His weight loss reportedly started in August as a result of dietary changes resulting in elimination of refined sugar/carbs.  HOwever given his history of lung nodules,  Radiographic changes suggestive of cirrhosis,  Will  obtain CT chest abd and pelvis.

## 2021-10-08 NOTE — Assessment & Plan Note (Signed)
Irregular HB on exam today .  I have ordered and reviewed a 12 lead EKG and find that there are  Frequent PVCs and possible atrial fibrillation .  Referral to cardiology in progress.  Checking tsh and mg

## 2021-10-08 NOTE — Progress Notes (Signed)
Subjective:  Patient ID: Richard Clayton, male    DOB: January 26, 1946  Age: 75 y.o. MRN: [id number]  CC: The primary encounter diagnosis was Hepatic cirrhosis, unspecified hepatic cirrhosis type, unspecified whether ascites present (HCC). Diagnoses of Aortic root dilatation (HCC), Unintentional weight loss of 10% body weight within 6 months, Hyperlipidemia with target LDL less than 160, Family history of B12 deficiency, Vitamin D deficiency, B12 deficiency, and Cardiac arrhythmia, unspecified cardiac arrhythmia type were also pertinent to this visit.  HPI Richard Clayton presents for  Chief Complaint  Patient presents with   Follow-up    6 month follow up    This visit occurred during the SARS-CoV-2 public health emergency.  Safety protocols were in place, including screening questions prior to the visit, additional usage of staff PPE, and extensive cleaning of exam room while observing appropriate contact time as indicated for disinfecting solutions.   UNINTENTIONAL WEIGHT LOSS:  reviewed diet.  He has  ELIMINATED "150 g sugar  from diet" (meaning he limits carbs to 35 daily) , Started checking cbgs with a glucometer 4 times daily with cbgs ranging from 70 to 99 except after dinner.  Gave up oatmeal/bananas  For breakfast,  Eating mixed nuts ,  Tuna and nuts for lunch.  Dinner is chicken and carrots.  2 snacks daily of  Greek yogurt and berries .   Feels great.  Energy and cognition have improved.WORKOUTS 4 DAYS PER WEEK AND WALKING DAILY  FEELS GREAT.  Went through a period of irregularity,  but for the last 2 weeks bowels are moving normally.   Eating 2200 cal daily 20% protein 55% fats.    He has an abnormal liver appearance suggestive of cirrhosis by last years ultrasound, done to evaluate a persistent elevation of is alk phos enzyme.     HIV screening  was done previously for insurance and he has not been  sexually active.   Aortic aneurysm found on 2019 CT :  stable by comparison with  films from over 10 years ago.    Outpatient Medications Prior to Visit  Medication Sig Dispense Refill   Ascorbic Acid (VITAMIN C PO) Take 1,000 mg by mouth in the morning and at bedtime.     Cholecalciferol (VITAMIN D3 PO) Take 4,000 Units by mouth daily.     Lecithin 1200 MG CAPS Take by mouth daily.     Misc Natural Products (GLUCOSAMINE CHOND MSM FORMULA PO) Take 1 tablet by mouth daily. (Patient not taking: Reported on 04/14/2021)     VITAMIN E PO Take 800 Units by mouth in the morning and at bedtime.     No facility-administered medications prior to visit.    Review of Systems;  Patient denies headache, fevers, malaise,  skin rash, eye pain, sinus congestion and sinus pain, sore throat, dysphagia,  hemoptysis , cough, dyspnea, wheezing, chest pain, palpitations, orthopnea, edema, abdominal pain, nausea, melena, diarrhea, constipation, flank pain, dysuria, hematuria, urinary  Frequency, nocturia, numbness, tingling, seizures,  Focal weakness, Loss of consciousness,  Tremor, insomnia, depression, anxiety, and suicidal ideation.      Objective:  BP 108/66 (BP Location: Left Arm, Patient Position: Sitting, Cuff Size: Normal)   Pulse (!) 57   Temp (!) 95.4 F (35.2 C) (Temporal)   Ht 5\' 11"  (1.803 m)   Wt 151 lb 12.8 oz (68.9 kg)   SpO2 99%   BMI 21.17 kg/m   BP Readings from Last 3 Encounters:  10/08/21 108/66  04/14/21 112/78  10/02/20 132/88  Wt Readings from Last 3 Encounters:  10/08/21 151 lb 12.8 oz (68.9 kg)  04/14/21 168 lb 3.2 oz (76.3 kg)  10/02/20 170 lb (77.1 kg)    General appearance: gaunt appearing alert, cooperative and appears stated age Ears: normal TM's and external ear canals both ears Throat: lips, mucosa, and tongue normal; teeth and gums normal Neck: no adenopathy, no carotid bruit, supple, symmetrical, trachea midline and thyroid not enlarged, symmetric, no tenderness/mass/nodules Back: symmetric, no curvature. ROM normal. No CVA  tenderness. Lungs: clear to auscultation bilaterally Heart: regular rate and rhythm, S1, S2 normal, no murmur, click, rub or gallop Abdomen: soft, non-tender; bowel sounds normal; no masses,  no organomegaly Pulses: 2+ and symmetric Skin: Skin color, texture, turgor normal. No rashes or lesions Lymph nodes: Cervical, supraclavicular, and axillary nodes normal.  No results found for: HGBA1C  Lab Results  Component Value Date   CREATININE 0.94 04/14/2021   CREATININE 0.87 03/04/2020   CREATININE 1.02 04/28/2018    Lab Results  Component Value Date   WBC 4.8 04/14/2021   HGB 14.8 04/14/2021   HCT 43.5 04/14/2021   PLT 186.0 04/14/2021   GLUCOSE 94 04/14/2021   CHOL 241 (H) 04/14/2021   TRIG 62.0 04/14/2021   HDL 56.60 04/14/2021   LDLCALC 172 (H) 04/14/2021   ALT 18 04/14/2021   AST 19 04/14/2021   NA 138 04/14/2021   K 4.5 04/14/2021   CL 101 04/14/2021   CREATININE 0.94 04/14/2021   BUN 14 04/14/2021   CO2 29 04/14/2021   TSH 1.57 04/14/2021   PSA 1.59 03/04/2020   MICROALBUR 0.2 12/13/2013    US Abdomen Limited RUQ (LIVER/GB)  Result Date: 05/20/2021 CLINICAL DATA:  Elevated liver enzymes. EXAM: ULTRASOUND ABDOMEN LIMITED RIGHT UPPER QUADRANT COMPARISON:  None. FINDINGS: Gallbladder: No gallstones or wall thickening visualized (1.6 mm). No sonographic Murphy sign noted by sonographer. Common bile duct: Diameter: 3.6 mm Liver: No focal lesion identified. The liver parenchyma is coarse in echotexture. Portal vein is patent on color Doppler imaging with normal direction of blood flow towards the liver. Other: None. IMPRESSION: 1. Coarse echotexture of the liver which may represent sequelae associated with cirrhosis. Correlation with hepatic MRI or CT is recommended. Electronically Signed   By: Richard Clayton M.D.   On: 05/20/2021 07:00    Assessment & Plan:   Problem List Items Addressed This Visit     Hyperlipidemia with target LDL less than 160   Relevant Orders    Lipid panel   Aortic root dilatation ([place])    He has deferred  additional scanning since his aortic root dilatation has not increased in 16 years      Unintentional weight loss of 10% body weight within 6 months    His weight loss reportedly started in August as a result of dietary changes resulting in elimination of refined sugar/carbs.  HOwever given his history of lung nodules,  Radiographic changes suggestive of cirrhosis,  Will  obtain CT chest abd and pelvis.         Relevant Orders   PSA, Medicare   Hemoglobin A1c   TSH   Comprehensive metabolic panel   CBC with Differential/Platelet   CT CHEST ABDOMEN PELVIS W CONTRAST   Cardiac arrhythmia    Irregular HB on exam today .  I have ordered and reviewed a 12 lead EKG and find that there are  Frequent PVCs and possible atrial fibrillation .  Referral to cardiology in progress.  Checking tsh  and mg        Relevant Orders   EKG 12-Lead (Completed)   Magnesium   Ambulatory referral to Cardiology   Other Visit Diagnoses     Hepatic cirrhosis, unspecified hepatic cirrhosis type, unspecified whether ascites present ([place])    -  Primary   Relevant Orders   AFP tumor marker   Family history of B12 deficiency       Relevant Orders   Vitamin B12   Vitamin D deficiency       Relevant Orders   VITAMIN D 25 Hydroxy (Vit-D Deficiency, Fractures)   B12 deficiency       Relevant Orders   Vitamin B12       I have discontinued Rilen Zweber "Sal"'s Misc Natural Products (GLUCOSAMINE CHOND MSM FORMULA PO), Lecithin, VITAMIN E PO, Cholecalciferol (VITAMIN D3 PO), and Ascorbic Acid (VITAMIN C PO).  No orders of the defined types were placed in this encounter.   Medications Discontinued During This Encounter  Medication Reason   Ascorbic Acid (VITAMIN C PO) Patient Preference   Cholecalciferol (VITAMIN D3 PO) Patient Preference   Lecithin 1200 MG CAPS Patient Preference   Misc Natural Products (GLUCOSAMINE CHOND MSM FORMULA PO)  Patient Preference   VITAMIN E PO Patient Preference    Follow-up: No follow-ups on file.   Crecencio Mc, MD

## 2021-10-09 ENCOUNTER — Other Ambulatory Visit: Payer: Self-pay | Admitting: Internal Medicine

## 2021-10-09 DIAGNOSIS — R634 Abnormal weight loss: Secondary | ICD-10-CM

## 2021-10-09 DIAGNOSIS — R932 Abnormal findings on diagnostic imaging of liver and biliary tract: Secondary | ICD-10-CM

## 2021-10-09 LAB — AFP TUMOR MARKER: AFP-Tumor Marker: 8 ng/mL — ABNORMAL HIGH (ref <6.1)

## 2021-10-14 ENCOUNTER — Telehealth: Payer: Self-pay | Admitting: Internal Medicine

## 2021-10-14 NOTE — Telephone Encounter (Signed)
Lft pt vm to call ofc to sch CT's. thanks 

## 2021-10-20 ENCOUNTER — Other Ambulatory Visit: Payer: Self-pay | Admitting: Internal Medicine

## 2021-10-20 DIAGNOSIS — R634 Abnormal weight loss: Secondary | ICD-10-CM

## 2021-10-20 DIAGNOSIS — K7460 Unspecified cirrhosis of liver: Secondary | ICD-10-CM

## 2021-11-05 ENCOUNTER — Ambulatory Visit: Payer: Medicare PPO | Admitting: Internal Medicine

## 2021-11-05 ENCOUNTER — Other Ambulatory Visit: Payer: Self-pay

## 2021-11-05 ENCOUNTER — Encounter: Payer: Self-pay | Admitting: Internal Medicine

## 2021-11-05 VITALS — BP 120/80 | HR 72 | Ht 71.0 in | Wt 151.0 lb

## 2021-11-05 DIAGNOSIS — I493 Ventricular premature depolarization: Secondary | ICD-10-CM | POA: Diagnosis not present

## 2021-11-05 DIAGNOSIS — E7800 Pure hypercholesterolemia, unspecified: Secondary | ICD-10-CM | POA: Diagnosis not present

## 2021-11-05 DIAGNOSIS — I499 Cardiac arrhythmia, unspecified: Secondary | ICD-10-CM | POA: Diagnosis not present

## 2021-11-05 NOTE — Patient Instructions (Addendum)
Medication Instructions:   Your physician recommends that you continue on your current medications as directed. Please refer to the Current Medication list given to you today.  *If you need a refill on your cardiac medications before your next appointment, please call your pharmacy*   Lab Work:  Your physician recommends that you return for FASTING lab work in: 2 MONTHS (Lipid Panel)   Testing/Procedures:  Your physician has requested that you have an echocardiogram. Echocardiography is a painless test that uses sound waves to create images of your heart. It provides your doctor with information about the size and shape of your heart and how well your heart's chambers and valves are working. This procedure takes approximately one hour. There are no restrictions for this procedure.   Follow-Up: At [facility], you and your health needs are our priority.  As part of our continuing mission to provide you with exceptional heart care, we have created designated Provider Care Teams.  These Care Teams include your primary Cardiologist (physician) and Advanced Practice Providers (APPs -  Physician Assistants and Nurse Practitioners) who all work together to provide you with the care you need, when you need it.  We recommend signing up for the patient portal called "MyChart".  Sign up information is provided on this After Visit Summary.  MyChart is used to connect with patients for Virtual Visits (Telemedicine).  Patients are able to view lab/test results, encounter notes, upcoming appointments, etc.  Non-urgent messages can be sent to your provider as well.   To learn more about what you can do with MyChart, go to [email or web address].    Your next appointment:   3 month(s)  The format for your next appointment:   In Person  Provider:   You may see Dr. Cristal Deer End or one of the following Advanced Practice Providers on your designated Care Team:   Nicolasa Ducking, NP Eula Listen,  PA-C Cadence Fransico Michael, [place]

## 2021-11-05 NOTE — Progress Notes (Signed)
New Outpatient Visit Date: 11/05/2021  Referring Provider: [name] Shams, MD [address] [place],  [place] [address]  Chief Complaint: Abnormal heart rhythm  HPI:  Mr. Benedick is a 74 y.o. male who is being seen today for the evaluation of arrhythmia at the request of Dr. Darrick Huntsman. He has a history of hyperlipidemia, dilated aortic root stable by CT greater than 10 years, and cirrhosis.  He was recently seen by Dr. Darrick Huntsman for routine follow-up and was noted to have an irregular heartbeat.  EKG showed PVC's and possible atrial fibrillation.  Today, Mr. Cail reports that he has been feeling well.  He exercises at least 5 days a week without any difficulty.  He has not had any chest pain, shortness of breath, palpitations, lightheadedness, or edema.  He has lost quite a bit of weight over the last few months, which he attributes to significant sugar restriction in an effort to improve his blood glucose.  His weight loss seems to have leveled off.  He is scheduled for a CT of the chest, abdomen, and pelvis tomorrow.  Mr. Rosser reports significant myalgias with multiple statins in the past.  He is trying to lower his cholesterol with dietary changes, after lipid panel last month revealed an LDL of 214.  --------------------------------------------------------------------------------------------------  Cardiovascular History & Procedures: Cardiovascular Problems: PVC's and questionable atrial fibrillation  Risk Factors: Hyperlipidemia, male gender, prior tobacco use, and age greater than 63  Cath/PCI: None  CV Surgery: None  EP Procedures and Devices: None  Non-Invasive Evaluation(s): Coronary calcium score (11/07/2018): Coronary calcium score 5.9 (22nd percentile for age and sex matched control).  Mild dilation of ascending aorta up to 4.3 cm noted.  Recent CV Pertinent Labs: Lab Results  Component Value Date   CHOL 284 (H) 10/08/2021   HDL 57.00 10/08/2021   LDLCALC  214 (H) 10/08/2021   TRIG 64.0 10/08/2021   CHOLHDL 5 10/08/2021   K 4.4 10/08/2021   MG 2.0 10/08/2021   BUN 20 10/08/2021   CREATININE 0.98 10/08/2021    --------------------------------------------------------------------------------------------------  Past Medical History:  Diagnosis Date   Cirrhosis (HCC)    Hemorrhoids    Hyperlipidemia    Thoracic aortic aneurysm     Past Surgical History:  Procedure Laterality Date   CATARACT EXTRACTION W/PHACO Left 04/15/2015   Procedure: CATARACT EXTRACTION PHACO AND INTRAOCULAR LENS PLACEMENT (IOC);  Surgeon: Sallee Lange, MD;  Location: [facility] ORS;  Service: Ophthalmology;  Laterality: Left;  [place] 00:47 AP% 22.7 CDE 20.56 Note:suture placed in left eye at [facility] of procedure   COLONOSCOPY  2011   [place].   COLONOSCOPY WITH PROPOFOL N/A 09/04/2015   Procedure: COLONOSCOPY WITH PROPOFOL;  Surgeon: Earline Mayotte, MD;  Location: [facility] ENDOSCOPY;  Service: Endoscopy;  Laterality: N/A;   COLONOSCOPY WITH PROPOFOL N/A 10/02/2020   Procedure: COLONOSCOPY WITH PROPOFOL;  Surgeon: Earline Mayotte, MD;  Location: [facility] ENDOSCOPY;  Service: Endoscopy;  Laterality: N/A;   EYE SURGERY     cataract   TONSILECTOMY/ADENOIDECTOMY WITH MYRINGOTOMY  1950   TONSILLECTOMY      Current Meds  Medication Sig   Cyanocobalamin 2500 MCG SUBL Place 2,500 mcg under the tongue daily.    Allergies: Statins  Social History   Tobacco Use   Smoking status: Former    Packs/day: 1.00    Types: Cigarettes    Quit date: 12/13/1968    Years since quitting: 52.9   Smokeless tobacco: Never  Vaping Use  Vaping Use: Never used  Substance Use Topics   Alcohol use: Yes    Comment: 1 glass of wine per week   Drug use: No    Family History  Problem Relation Age of Onset   Hyperlipidemia Mother    Cancer Father 65       from working in Medical laboratory scientific officer   Cancer Sister 72       in remission    Review of Systems: A 12-system review of systems was  performed and was negative except as noted in the HPI.  --------------------------------------------------------------------------------------------------  Physical Exam: BP 120/80 (BP Location: Right Arm, Patient Position: Sitting, Cuff Size: Normal)   Pulse 72   Ht 5\' 11"  (1.803 m)   Wt 151 lb (68.5 kg)   SpO2 98%   BMI 21.06 kg/m   General: NAD. HEENT: No conjunctival pallor or scleral icterus. Facemask in place. Neck: Supple without lymphadenopathy, thyromegaly, JVD, or HJR. No carotid bruit. Lungs: Normal work of breathing. Clear to auscultation bilaterally without wheezes or crackles. Heart: Regular rate and rhythm with occasional extrasystoles.  No murmurs, rubs, or gallops. Non-displaced PMI. Abd: Bowel sounds present. Soft, NT/ND without hepatosplenomegaly Ext: No lower extremity edema. Radial, PT, and DP pulses are 2+ bilaterally Skin: Warm and dry without rash. Neuro: CNIII-XII intact. Strength and fine-touch sensation intact in upper and lower extremities bilaterally. Psych: Normal mood and affect.  EKG: Normal sinus rhythm with first-degree AV block, low voltage, and multifocal PVCs.  No significant change from prior tracing on 10/08/2021.  Lab Results  Component Value Date   WBC 3.7 (L) 10/08/2021   HGB 13.4 10/08/2021   HCT 39.9 10/08/2021   MCV 87.8 10/08/2021   PLT 170.0 10/08/2021    Lab Results  Component Value Date   NA 136 10/08/2021   K 4.4 10/08/2021   CL 100 10/08/2021   CO2 30 10/08/2021   BUN 20 10/08/2021   CREATININE 0.98 10/08/2021   GLUCOSE 88 10/08/2021   ALT 18 10/08/2021    Lab Results  Component Value Date   CHOL 284 (H) 10/08/2021   HDL 57.00 10/08/2021   LDLCALC 214 (H) 10/08/2021   TRIG 64.0 10/08/2021   CHOLHDL 5 10/08/2021   Lab Results  Component Value Date   TSH 1.14 10/08/2021    --------------------------------------------------------------------------------------------------  ASSESSMENT AND PLAN: Arrhythmia with  PVCs: Mr. Honea remains asymptomatic despite again having multifocal PVCs on his EKG today.  Tracing today does not show evidence of atrial fibrillation.  His prior tracing from 10/08/2021 is somewhat limited by baseline artifact, though it appears to show sinus rhythm with PVCs also.  I do not see definite evidence of atrial fibrillation.  Recent labs did not show any significant abnormalities to explain PVCs.  I have recommended obtaining an echocardiogram.  If there is no evidence of significant structural abnormality/cardiomyopathy, we will then move forward with an exercise myocardial perfusion stress test.  We will defer medication changes at this time.  Hyperlipidemia: LDL noted to be quite elevated on recent check, suggestive of heterozygous familial hyperlipidemia.  LDL was much better controlled in the past when Mr. Duross was on statins, though he was ultimately intolerant of multiple agents in this class due to myalgias.  I have encouraged him to continue working on lifestyle modifications, as his coronary calcium score in 2019 was notably low.  We will plan to repeat a lipid panel in 2 months to assess his progress.  Shared Decision Making/Informed Consent The risks [  chest pain, shortness of breath, cardiac arrhythmias, dizziness, blood pressure fluctuations, myocardial infarction, stroke/transient ischemic attack, nausea, vomiting, allergic reaction, radiation exposure, metallic taste sensation and life-threatening complications (estimated to be 1 in 10,000)], benefits (risk stratification, diagnosing coronary artery disease, treatment guidance) and alternatives of a nuclear stress test were discussed in detail with Mr. Stuhr and he agrees to proceed.  Follow-up: Return to clinic in 3 months.  Yvonne Kendall, MD 11/05/2021 9:51 AM

## 2021-11-06 ENCOUNTER — Ambulatory Visit
Admission: RE | Admit: 2021-11-06 | Discharge: 2021-11-06 | Disposition: A | Discharge status: Home / Self Care | Payer: Medicare PPO | Source: Ambulatory Visit | Attending: Internal Medicine | Admitting: Internal Medicine

## 2021-11-06 DIAGNOSIS — K7460 Unspecified cirrhosis of liver: Secondary | ICD-10-CM | POA: Diagnosis not present

## 2021-11-06 DIAGNOSIS — R634 Abnormal weight loss: Secondary | ICD-10-CM | POA: Diagnosis not present

## 2021-11-06 DIAGNOSIS — K5730 Diverticulosis of large intestine without perforation or abscess without bleeding: Secondary | ICD-10-CM | POA: Diagnosis not present

## 2021-11-06 DIAGNOSIS — N281 Cyst of kidney, acquired: Secondary | ICD-10-CM | POA: Diagnosis not present

## 2021-11-06 DIAGNOSIS — N400 Enlarged prostate without lower urinary tract symptoms: Secondary | ICD-10-CM | POA: Diagnosis not present

## 2021-11-06 MED ORDER — IOHEXOL 300 MG/ML  SOLN
100.0000 mL | Freq: Once | INTRAMUSCULAR | Status: AC | PRN
  Administered 2021-11-06: 100 mL via INTRAVENOUS

## 2021-12-11 ENCOUNTER — Ambulatory Visit (INDEPENDENT_AMBULATORY_CARE_PROVIDER_SITE_OTHER): Payer: Medicare PPO

## 2021-12-11 ENCOUNTER — Other Ambulatory Visit: Payer: Self-pay

## 2021-12-11 DIAGNOSIS — I493 Ventricular premature depolarization: Secondary | ICD-10-CM | POA: Diagnosis not present

## 2021-12-11 LAB — ECHOCARDIOGRAM COMPLETE
AR max vel: 3.26 cm2
AV Area VTI: 3.06 cm2
AV Area mean vel: 3.26 cm2
AV Mean grad: 4 mmHg
AV Peak grad: 5.8 mmHg
AV Vena cont: 0.3 cm
Ao pk vel: 1.2 m/s
Area-P 1/2: 3.56 cm2
Calc EF: 58.4 %
MV M vel: 4.86 m/s
MV Peak grad: 94.3 mmHg
P 1/2 time: 507 msec
S' Lateral: 4 cm
Single Plane A2C EF: 58.8 %
Single Plane A4C EF: 58.7 %

## 2021-12-12 ENCOUNTER — Telehealth: Payer: Self-pay | Admitting: *Deleted

## 2021-12-12 DIAGNOSIS — I493 Ventricular premature depolarization: Secondary | ICD-10-CM

## 2021-12-12 DIAGNOSIS — I499 Cardiac arrhythmia, unspecified: Secondary | ICD-10-CM

## 2021-12-12 NOTE — Telephone Encounter (Signed)
Attempted to call pt. No answer at this time. No vm. Will call pt back at another time.

## 2021-12-12 NOTE — Telephone Encounter (Signed)
-----   Message from Yvonne Kendall, MD sent at 12/12/2021  2:50 PM EST ----- Please let Richard Clayton know that his echocardiogram shows that his heart's pumping function is slightly lower than normal.  There is also mild-moderate leakage of the mitral, tricuspid, and aortic valves.  I recommend that we obtain a pharmacologic myocardial perfusion stress test, as we discussed at our last visit, and follow-up shortly thereafter to reassess his symptoms and review the test results further in person.

## 2021-12-22 NOTE — Telephone Encounter (Addendum)
Spoke with pt. Notified of echo results and Dr. Serita Kyle recc.  Reviewed instructions for [facility] below with pt.  Pt aware scheduling will call him to set up date/time at [facility].  Pt voiced understanding and has no further questions.  Forwarding to scheduling to set up [facility] and to r/s f/u shortly after myoview.   [facility] MYOVIEW  Your caregiver has ordered a Stress Test with nuclear imaging. The purpose of this test is to evaluate the blood supply to your heart muscle. This procedure is referred to as a "Non-Invasive Stress Test." This is because other than having an IV started in your vein, nothing is inserted or "invades" your body. Cardiac stress tests are done to find areas of poor blood flow to the heart by determining the extent of coronary artery disease (CAD). Some patients exercise on a treadmill, which naturally increases the blood flow to your heart, while others who are  unable to walk on a treadmill due to physical limitations have a pharmacologic/chemical stress agent called Lexiscan . This medicine will mimic walking on a treadmill by temporarily increasing your coronary blood flow.   Please note: these test may take anywhere between 2-4 hours to complete  PLEASE REPORT TO [facility] MEDICAL MALL ENTRANCE  THE VOLUNTEERS AT THE FIRST DESK WILL DIRECT YOU WHERE TO GO  Date of Procedure:_____________________________________  Arrival Time for Procedure:______________________________  Instructions regarding medication:   PLEASE NOTIFY THE OFFICE AT LEAST 24 HOURS IN ADVANCE IF YOU ARE UNABLE TO KEEP YOUR APPOINTMENT.  [phone] AND  PLEASE NOTIFY NUCLEAR MEDICINE AT [facility] AT LEAST 24 HOURS IN ADVANCE IF YOU ARE UNABLE TO KEEP YOUR APPOINTMENT. [phone]  How to prepare for your Myoview test:  Do not eat or drink after midnight No caffeine for 24 hours prior to test No smoking 24 hours prior to test. Your medication may be taken with water.  If your doctor stopped a  medication because of this test, do not take that medication. Ladies, please do not wear dresses.  Skirts or pants are appropriate. Please wear a short sleeve shirt. No perfume, cologne or lotion.

## 2021-12-22 NOTE — Addendum Note (Signed)
Addended by: Darlyne Russian on: 12/22/2021 03:40 PM   Modules accepted: Orders

## 2021-12-22 NOTE — Telephone Encounter (Signed)
Orders placed. Thank you.

## 2021-12-22 NOTE — Telephone Encounter (Signed)
Order pending

## 2021-12-24 NOTE — Telephone Encounter (Signed)
Pt made aware that he may bring his prior myoview results with him to his follow up appointment.  Pt has no further questions at this time.

## 2022-01-21 ENCOUNTER — Other Ambulatory Visit (INDEPENDENT_AMBULATORY_CARE_PROVIDER_SITE_OTHER): Payer: Medicare PPO

## 2022-01-21 ENCOUNTER — Other Ambulatory Visit: Payer: Self-pay

## 2022-01-21 DIAGNOSIS — E7800 Pure hypercholesterolemia, unspecified: Secondary | ICD-10-CM

## 2022-01-22 LAB — LIPID PANEL
Chol/HDL Ratio: 3.4 ratio (ref 0.0–5.0)
Cholesterol, Total: 205 mg/dL — ABNORMAL HIGH (ref 100–199)
HDL: 60 mg/dL (ref >39)
LDL Chol Calc (NIH): 136 mg/dL — ABNORMAL HIGH (ref 0–99)
Triglycerides: 52 mg/dL (ref 0–149)
VLDL Cholesterol Cal: 9 mg/dL (ref 5–40)

## 2022-02-11 NOTE — Progress Notes (Signed)
? ?Follow-up Outpatient Visit ?Date: 02/12/2022 ? ?Primary Care Provider: ?Crecencio Mc, MD ?[address] ?[place] [address] ? ?Chief Complaint: Follow-up PVC's and echocardiogram ? ?HPI:  Richard Clayton is a 76 y.o. male with history of PVCs, hyperlipidemia, dilated aortic root stable by CT greater than 10 years, and cirrhosis, who presents for follow-up of arrhythmia.  I met him in 10/2021 for evaluation of possible atrial fibrillation.  EKGs were reviewed and felt to be most consistent with sinus rhythm with PVCs.  Subsequent echo showed LVEF of 45-50% with moderate pulmonary hypertension, moderate biatrial enlargement, mild mitral and aortic regurgitation, mild-moderate tricuspid regurgitation, and severely elevated CVP.  Pharmacologic MPI was recommended but has yet to be completed. ? ?Today, Richard Clayton reports that he continues to feel well without chest pain, shortness of breath, palpitations, lightheadedness, and edema.  He continues to exercise regularly without difficulty.  He was never contracted by radiology to schedule MPI. ? ?-------------------------------------------------------------------------------------------------- ? ?Cardiovascular History & Procedures: ?Cardiovascular Problems: ?PVC's and questionable atrial fibrillation ?  ?Risk Factors: ?Hyperlipidemia, male gender, prior tobacco use, and age greater than 20 ?  ?Cath/PCI: ?None ?  ?CV Surgery: ?None ?  ?EP Procedures and Devices: ?None ?  ?Non-Invasive Evaluation(s): ?TTE (12/11/2021): Normal LV size and wall thickness.  LVEF 45-50% with grade 1 diastolic dysfunction.  Normal RV size and function.  Moderate pulmonary hypertension.  Moderate biatrial enlargement.  No pericardial effusion.  Mild MR.  Mild-moderate TR.  Mild AI.  CVP 15 mmHg. ?Coronary calcium score (11/07/2018): Coronary calcium score 5.9 (22nd percentile for age and sex matched control).  Mild dilation of ascending aorta up to 4.3 cm noted. ? ?Recent CV  Pertinent Labs: ?Lab Results  ?Component Value Date  ? CHOL 205 (H) 01/21/2022  ? HDL 60 01/21/2022  ? LDLCALC 136 (H) 01/21/2022  ? TRIG 52 01/21/2022  ? CHOLHDL 3.4 01/21/2022  ? CHOLHDL 5 10/08/2021  ? K 4.4 10/08/2021  ? MG 2.0 10/08/2021  ? BUN 20 10/08/2021  ? CREATININE 0.98 10/08/2021  ? ? ?Past medical and surgical history were reviewed and updated in EPIC. ? ?Current Meds  ?Medication Sig  ? cholecalciferol (VITAMIN D3) 25 MCG (1000 UNIT) tablet Take 1,000 Units by mouth daily.  ? Cyanocobalamin 2500 MCG SUBL Place 2,500 mcg under the tongue daily.  ? ? ?Allergies: Statins ? ?Social History  ? ?Tobacco Use  ? Smoking status: Former  ?  Packs/day: 1.00  ?  Types: Cigarettes  ?  Quit date: 12/13/1968  ?  Years since quitting: 53.2  ? Smokeless tobacco: Never  ?Vaping Use  ? Vaping Use: Never used  ?Substance Use Topics  ? Alcohol use: Yes  ?  Comment: 1 glass of wine per week (most weeks)  ? Drug use: No  ? ? ?Family History  ?Problem Relation Age of Onset  ? Hyperlipidemia Mother   ? Cancer Father 54  ?     from working in [employer]  ? Cancer Sister 44  ?     in remission  ? Coronary artery disease Brother 26  ?     s/p stent  ? ? ?Review of Systems: ?A 12-system review of systems was performed and was negative except as noted in the HPI. ? ?-------------------------------------------------------------------------------------------------- ? ?Physical Exam: ?BP 108/68 (BP Location: Left Arm, Patient Position: Sitting, Cuff Size: Normal)   Pulse 71   Ht _0  (1.803 m)   Wt 154 lb (69.9 kg)  SpO2 98%   BMI 21.48 kg/m?  ? ?General:  NAD. ?Neck: No JVD or HJR. ?Lungs: Clear to auscultation bilaterally without wheezes or crackles. ?Heart: Regular rate and rhythm with occasional extrasystoles.  No murmurs, rubs, or gallops. ?Abdomen: Soft, nontender, nondistended. ?Extremities: No lower extremity edema. ? ?Lab Results  ?Component Value Date  ? WBC 3.7 (L) 10/08/2021  ? HGB 13.4 10/08/2021  ? HCT 39.9  10/08/2021  ? MCV 87.8 10/08/2021  ? PLT 170.0 10/08/2021  ? ? ?Lab Results  ?Component Value Date  ? NA 136 10/08/2021  ? K 4.4 10/08/2021  ? CL 100 10/08/2021  ? CO2 30 10/08/2021  ? BUN 20 10/08/2021  ? CREATININE 0.98 10/08/2021  ? GLUCOSE 88 10/08/2021  ? ALT 18 10/08/2021  ? ? ?Lab Results  ?Component Value Date  ? CHOL 205 (H) 01/21/2022  ? HDL 60 01/21/2022  ? LDLCALC 136 (H) 01/21/2022  ? TRIG 52 01/21/2022  ? CHOLHDL 3.4 01/21/2022  ? ? ?-------------------------------------------------------------------------------------------------- ? ?ASSESSMENT AND PLAN: ?Cardiomyopathy and PVCs: ?Richard Clayton remains asymptomatic.  EKG was not done today but exam is again notable for occasional extrasystoles consistent with previously documented PVCs.  His echocardiogram in January showed mildly reduced LVEF with elevated central venous and pulmonary artery pressures.  We had agreed to perform a pharmacologic myocardial perfusion stress test, though this was never scheduled.  We have discussed moving forward with this and will arrange for an exercise myocardial perfusion stress test at the sterile [place]'s convenience.  He is not eager to begin any pharmacotherapy, which would also be complicated by his borderline low blood pressure at baseline.  We discussed further work-up including possible coronary angiography if stress test shows evidence of a blockage as well as ambulatory cardiac monitoring.  We will defer any additional testing beyond the MPI at this time, as well as medication changes. ? ?Shared Decision Making/Informed Consent ?The risks [chest pain, shortness of breath, cardiac arrhythmias, dizziness, blood pressure fluctuations, myocardial infarction, stroke/transient ischemic attack, nausea, vomiting, allergic reaction, radiation exposure, metallic taste sensation and life-threatening complications (estimated to be 1 in 10,000)], benefits (risk stratification, diagnosing coronary artery disease, treatment  guidance) and alternatives of a nuclear stress test were discussed with Richard Clayton and he agrees to proceed. ? ?Follow-up: Return to clinic in 6 months. ? ?Nelva Bush, MD ?02/12/2022 ?8:50 AM ? ?

## 2022-02-12 ENCOUNTER — Encounter: Payer: Self-pay | Admitting: Internal Medicine

## 2022-02-12 ENCOUNTER — Ambulatory Visit: Payer: Medicare PPO | Admitting: Internal Medicine

## 2022-02-12 ENCOUNTER — Other Ambulatory Visit: Payer: Self-pay

## 2022-02-12 VITALS — BP 108/68 | HR 71 | Ht 71.0 in | Wt 154.0 lb

## 2022-02-12 DIAGNOSIS — I428 Other cardiomyopathies: Secondary | ICD-10-CM | POA: Insufficient documentation

## 2022-02-12 DIAGNOSIS — I429 Cardiomyopathy, unspecified: Secondary | ICD-10-CM | POA: Diagnosis not present

## 2022-02-12 DIAGNOSIS — I493 Ventricular premature depolarization: Secondary | ICD-10-CM | POA: Diagnosis not present

## 2022-02-12 NOTE — Patient Instructions (Signed)
Medication Instructions:  ? ?Your physician recommends that you continue on your current medications as directed. Please refer to the Current Medication list given to you today. ? ?*If you need a refill on your cardiac medications before your next appointment, please call your pharmacy* ? ? ?Lab Work: ? ?None ordered ? ?Testing/Procedures: ? ?[facility] MYOVIEW ? ?Your caregiver has ordered a Pharmacologist) Stress Test with nuclear imaging. The purpose of this test is to evaluate the blood supply to your heart muscle. This procedure is referred to as a "Non-Invasive Stress Test." This is because other than having an IV started in your vein, nothing is inserted or "invades" your body. Cardiac stress tests are done to find areas of poor blood flow to the heart by determining the extent of coronary artery disease (CAD). Some patients exercise on a treadmill, which naturally increases the blood flow to your heart, while others who are  unable to walk on a treadmill due to physical limitations have a pharmacologic/chemical stress agent called Lexiscan . This medicine will mimic walking on a treadmill by temporarily increasing your coronary blood flow.  ? ?Please note: these test may take anywhere between 2-4 hours to complete ? ?PLEASE REPORT TO [facility] MEDICAL MALL ENTRANCE  ?THE VOLUNTEERS AT THE FIRST DESK WILL DIRECT YOU WHERE TO GO ? ?Date of Procedure:_____________________________________ ? ?Arrival Time for Procedure:______________________________ ? ? ?PLEASE NOTIFY THE OFFICE AT LEAST 24 HOURS IN ADVANCE IF YOU ARE UNABLE TO KEEP YOUR APPOINTMENT.  [phone] ?AND  ?PLEASE NOTIFY NUCLEAR MEDICINE AT [facility] AT LEAST 24 HOURS IN ADVANCE IF YOU ARE UNABLE TO KEEP YOUR APPOINTMENT. [phone] ? ?How to prepare for your Myoview test: ? ?Do not eat or drink after midnight ?No caffeine for 24 hours prior to test ?No smoking 24 hours prior to test. ?Your medication may be taken with water.  If your doctor stopped a  medication because of this test, do not take that medication. ?Please wear a short sleeve shirt. ?No perfume, cologne or lotion. ?Wear comfortable walking shoes. ? ? ?Follow-Up: ?At [facility], you and your health needs are our priority.  As part of our continuing mission to provide you with exceptional heart care, we have created designated Provider Care Teams.  These Care Teams include your primary Cardiologist (physician) and Advanced Practice Providers (APPs -  Physician Assistants and Nurse Practitioners) who all work together to provide you with the care you need, when you need it. ? ?We recommend signing up for the patient portal called "MyChart".  Sign up information is provided on this After Visit Summary.  MyChart is used to connect with patients for Virtual Visits (Telemedicine).  Patients are able to view lab/test results, encounter notes, upcoming appointments, etc.  Non-urgent messages can be sent to your provider as well.   ?To learn more about what you can do with MyChart, go to [email or web address].   ? ?Your next appointment:   ?6 month(s) ? ?The format for your next appointment:   ?In Person ? ?Provider:   ?You may see Yvonne Kendall, MD or one of the following Advanced Practice Providers on your designated Care Team:   ?Nicolasa Ducking, NP ?Eula Listen, PA-C ?Cadence Fransico Michael, PA-C ?

## 2022-02-17 ENCOUNTER — Ambulatory Visit
Admission: RE | Admit: 2022-02-17 | Discharge: 2022-02-17 | Disposition: A | Discharge status: Home / Self Care | Payer: Medicare PPO | Source: Ambulatory Visit | Attending: Internal Medicine | Admitting: Internal Medicine

## 2022-02-17 DIAGNOSIS — I493 Ventricular premature depolarization: Secondary | ICD-10-CM | POA: Diagnosis not present

## 2022-02-17 DIAGNOSIS — I429 Cardiomyopathy, unspecified: Secondary | ICD-10-CM

## 2022-02-17 LAB — NM MYOCAR MULTI W/SPECT W/WALL MOTION / EF
Angina Index: 0
Duke Treadmill Score: 9
Exercise duration (min): 9 min
Exercise duration (sec): 0 s
LV dias vol: 155 mL (ref 62–150)
LV sys vol: 77 mL
MPHR: 144 {beats}/min
Nuc Stress EF: 50 %
Peak HR: 151 {beats}/min
Percent HR: 104 %
Rest HR: 80 {beats}/min
Rest Nuclear Isotope Dose: 10.3 mCi
SDS: 0
SRS: 1
SSS: 1
ST Depression (mm): 0 mm
Stress Nuclear Isotope Dose: 32.2 mCi
TID: 0.97

## 2022-02-17 MED ORDER — TECHNETIUM TC 99M TETROFOSMIN IV KIT
10.0000 | PACK | Freq: Once | INTRAVENOUS | Status: AC | PRN
  Administered 2022-02-17: 10.3 via INTRAVENOUS

## 2022-02-17 MED ORDER — TECHNETIUM TC 99M TETROFOSMIN IV KIT
30.0000 | PACK | Freq: Once | INTRAVENOUS | Status: AC | PRN
  Administered 2022-02-17: 32.2 via INTRAVENOUS

## 2022-02-17 MED ORDER — REGADENOSON 0.4 MG/5ML IV SOLN
0.4000 mg | Freq: Once | INTRAVENOUS | Status: AC
  Administered 2022-02-17: 0.4 mg via INTRAVENOUS

## 2022-02-24 IMAGING — US US ABDOMEN LIMITED
1 series · 14 of 25 positions shown · non-contrast
Comparison: None.

CLINICAL DATA: Elevated liver enzymes.

EXAM:
ULTRASOUND ABDOMEN LIMITED RIGHT UPPER QUADRANT

[Series 1: us abdomen limited · 0.20mm/px · 14 of 54 slices shown]
[im 1/54]
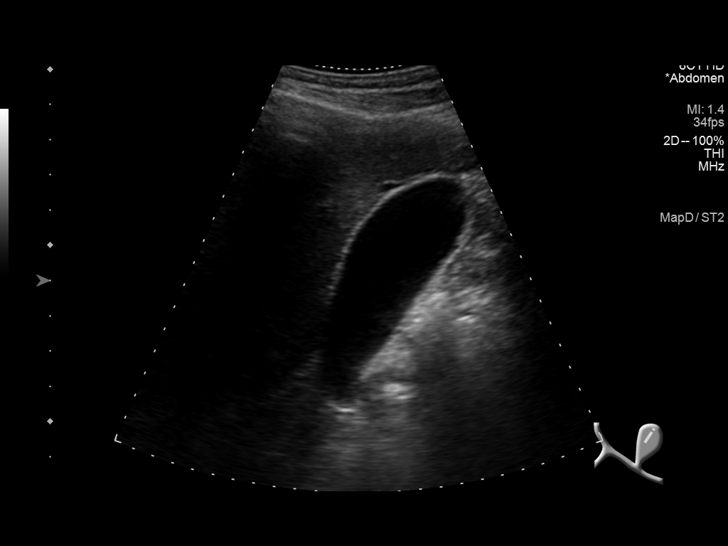
[im 5/54]
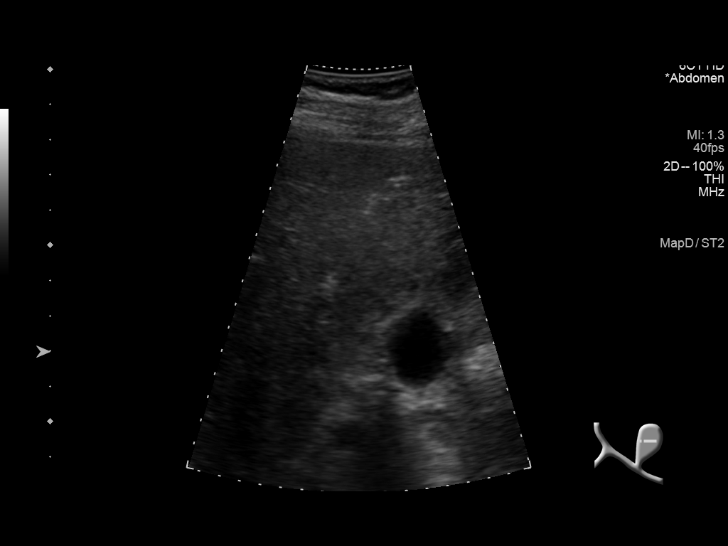
[im 9/54]
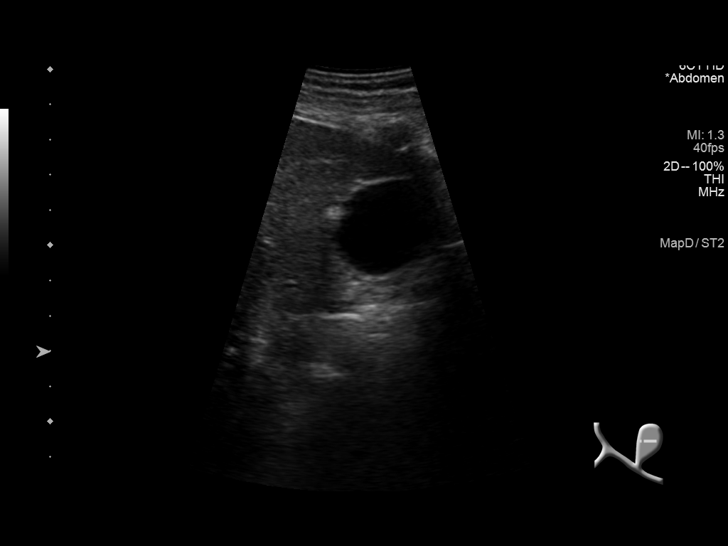
[im 14/54]
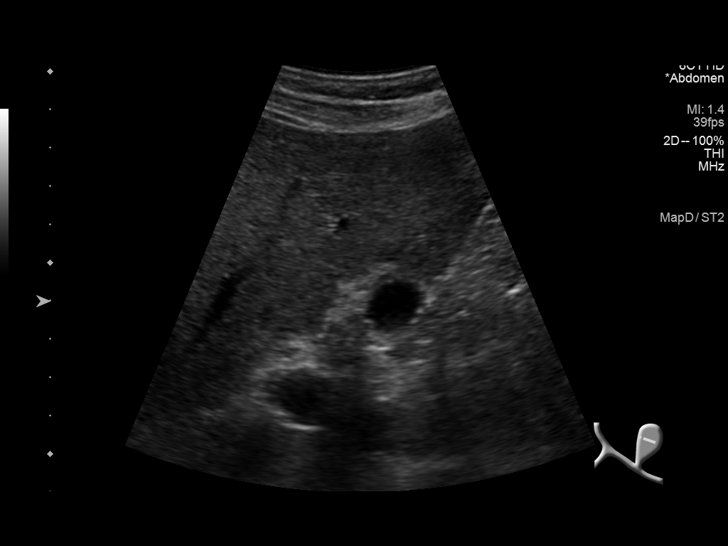
[im 18/54]
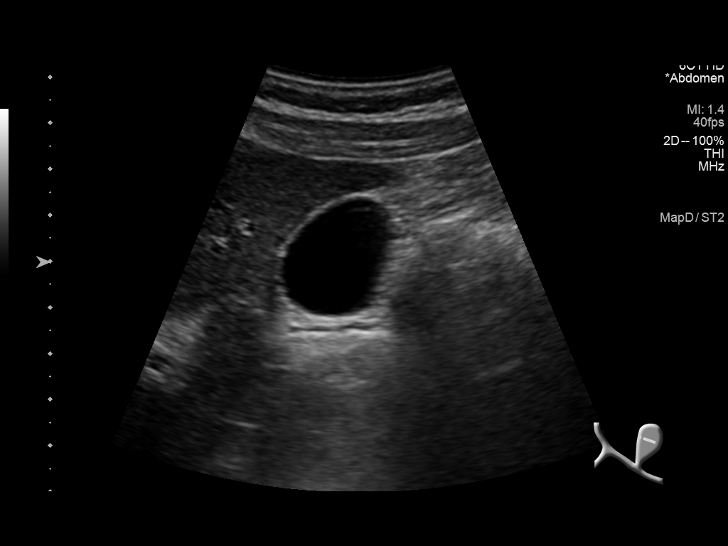
[im 20/54]
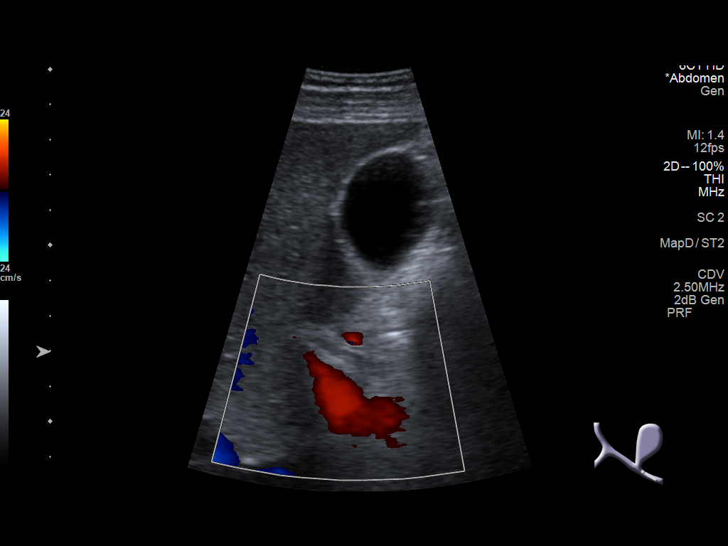
[im 25/54]
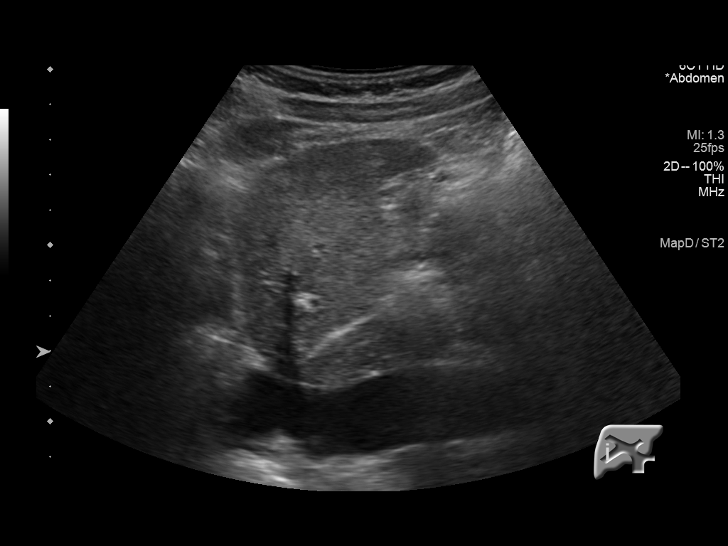
[im 29/54]
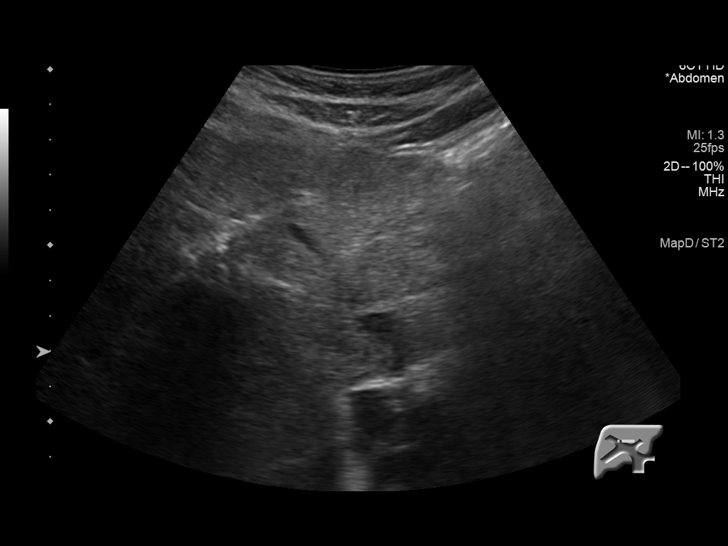
[im 34/54]
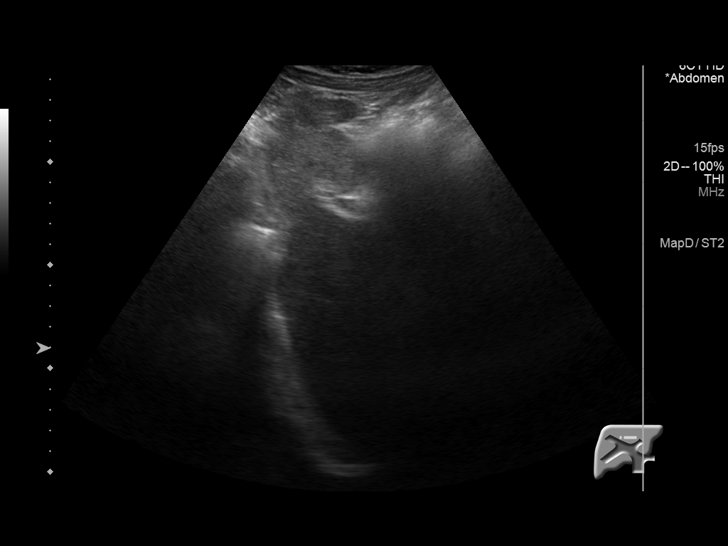
[im 36/54]
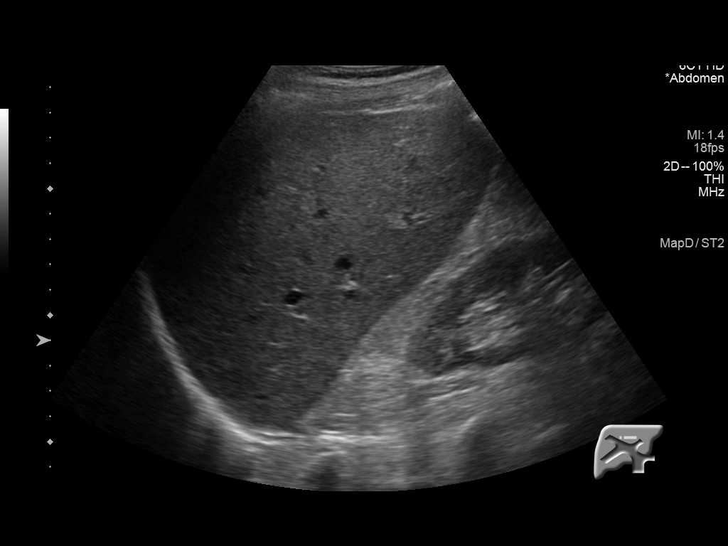
[im 40/54]
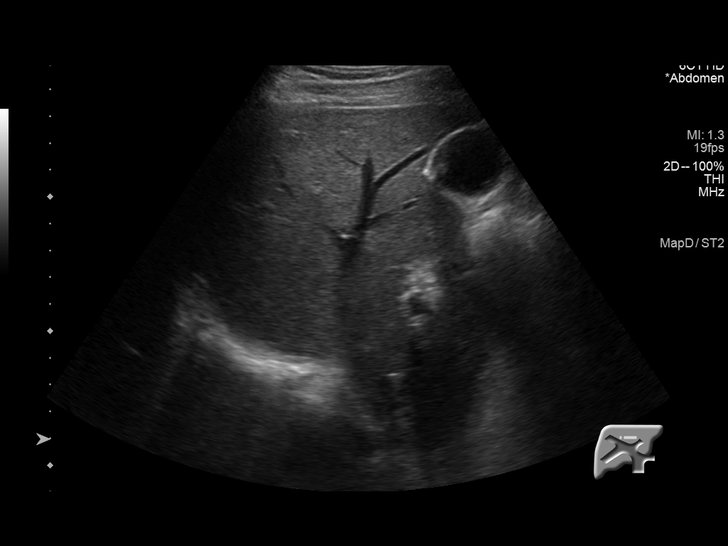
[im 45/54]
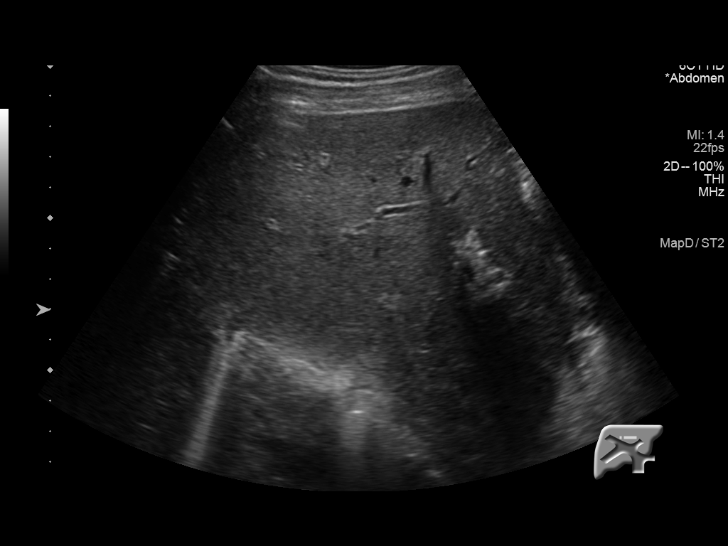
[im 49/54]
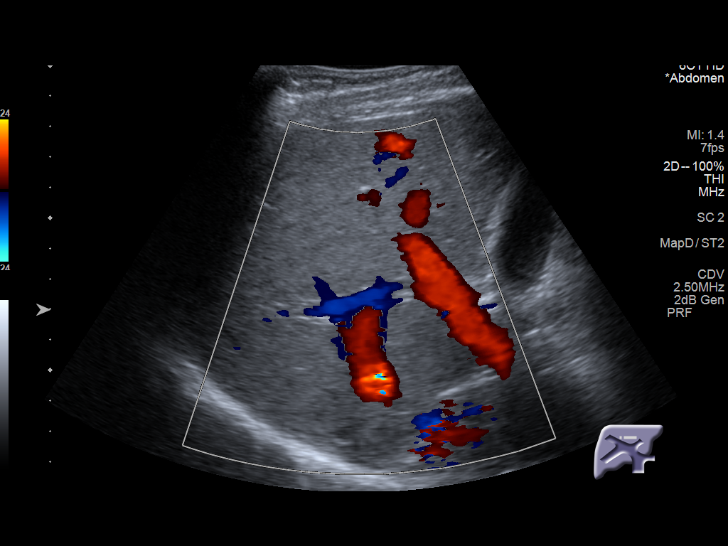
[im 54/54]
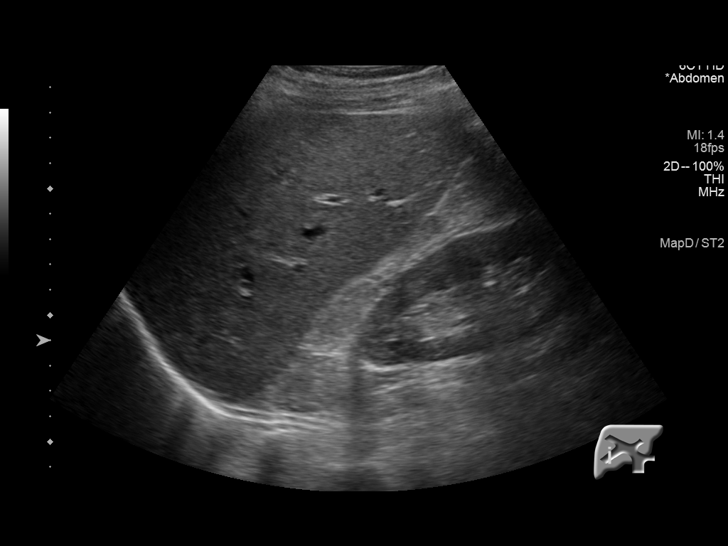

[14 of 25 positions shown; findings below may reference images not displayed]

FINDINGS: Gallbladder:

No gallstones or wall thickening visualized (1.6 mm). No sonographic
Murphy sign noted by sonographer.

Common bile duct:

Diameter: 3.6 mm

Liver:

No focal lesion identified. The liver parenchyma is coarse in
echotexture. Portal vein is patent on color Doppler imaging with
normal direction of blood flow towards the liver.

Other: None.
IMPRESSION: 1. Coarse echotexture of the liver which may represent sequelae
associated with cirrhosis. Correlation with hepatic MRI or CT is
recommended.

## 2022-03-18 DIAGNOSIS — H903 Sensorineural hearing loss, bilateral: Secondary | ICD-10-CM | POA: Diagnosis not present

## 2022-04-13 DIAGNOSIS — L570 Actinic keratosis: Secondary | ICD-10-CM | POA: Diagnosis not present

## 2022-04-13 DIAGNOSIS — L820 Inflamed seborrheic keratosis: Secondary | ICD-10-CM | POA: Diagnosis not present

## 2022-04-13 DIAGNOSIS — L814 Other melanin hyperpigmentation: Secondary | ICD-10-CM | POA: Diagnosis not present

## 2022-04-13 DIAGNOSIS — D492 Neoplasm of unspecified behavior of bone, soft tissue, and skin: Secondary | ICD-10-CM | POA: Diagnosis not present

## 2022-04-13 DIAGNOSIS — L821 Other seborrheic keratosis: Secondary | ICD-10-CM | POA: Diagnosis not present

## 2022-04-29 ENCOUNTER — Encounter: Payer: Self-pay | Admitting: Internal Medicine

## 2022-04-29 ENCOUNTER — Ambulatory Visit (INDEPENDENT_AMBULATORY_CARE_PROVIDER_SITE_OTHER): Payer: Medicare PPO | Admitting: Internal Medicine

## 2022-04-29 VITALS — BP 114/66 | HR 58 | Temp 97.4°F | Ht 71.0 in | Wt 155.0 lb

## 2022-04-29 DIAGNOSIS — R7301 Impaired fasting glucose: Secondary | ICD-10-CM | POA: Diagnosis not present

## 2022-04-29 DIAGNOSIS — R5383 Other fatigue: Secondary | ICD-10-CM

## 2022-04-29 DIAGNOSIS — E559 Vitamin D deficiency, unspecified: Secondary | ICD-10-CM

## 2022-04-29 DIAGNOSIS — I499 Cardiac arrhythmia, unspecified: Secondary | ICD-10-CM | POA: Diagnosis not present

## 2022-04-29 DIAGNOSIS — R634 Abnormal weight loss: Secondary | ICD-10-CM | POA: Diagnosis not present

## 2022-04-29 DIAGNOSIS — Z13820 Encounter for screening for osteoporosis: Secondary | ICD-10-CM

## 2022-04-29 DIAGNOSIS — I429 Cardiomyopathy, unspecified: Secondary | ICD-10-CM

## 2022-04-29 DIAGNOSIS — Z0000 Encounter for general adult medical examination without abnormal findings: Secondary | ICD-10-CM

## 2022-04-29 DIAGNOSIS — E785 Hyperlipidemia, unspecified: Secondary | ICD-10-CM

## 2022-04-29 DIAGNOSIS — E538 Deficiency of other specified B group vitamins: Secondary | ICD-10-CM | POA: Diagnosis not present

## 2022-04-29 LAB — COMPREHENSIVE METABOLIC PANEL
ALT: 19 U/L (ref 0–53)
AST: 20 U/L (ref 0–37)
Albumin: 4.3 g/dL (ref 3.5–5.2)
Alkaline Phosphatase: 133 U/L — ABNORMAL HIGH (ref 39–117)
BUN: 21 mg/dL (ref 6–23)
CO2: 29 mEq/L (ref 19–32)
Calcium: 9.6 mg/dL (ref 8.4–10.5)
Chloride: 105 mEq/L (ref 96–112)
Creatinine, Ser: 0.92 mg/dL (ref 0.40–1.50)
GFR: 80.59 mL/min (ref >60.00)
Glucose, Bld: 90 mg/dL (ref 70–99)
Potassium: 4.1 mEq/L (ref 3.5–5.1)
Sodium: 141 mEq/L (ref 135–145)
Total Bilirubin: 1.1 mg/dL (ref 0.2–1.2)
Total Protein: 6.7 g/dL (ref 6.0–8.3)

## 2022-04-29 LAB — VITAMIN D 25 HYDROXY (VIT D DEFICIENCY, FRACTURES): VITD: 37.09 ng/mL (ref 30.00–100.00)

## 2022-04-29 LAB — CBC WITH DIFFERENTIAL/PLATELET
Basophils Absolute: 0.1 10*3/uL (ref 0.0–0.1)
Basophils Relative: 1.1 % (ref 0.0–3.0)
Eosinophils Absolute: 0.1 10*3/uL (ref 0.0–0.7)
Eosinophils Relative: 2.3 % (ref 0.0–5.0)
HCT: 42.2 % (ref 39.0–52.0)
Hemoglobin: 14.2 g/dL (ref 13.0–17.0)
Lymphocytes Relative: 15.5 % (ref 12.0–46.0)
Lymphs Abs: 0.8 10*3/uL (ref 0.7–4.0)
MCHC: 33.6 g/dL (ref 30.0–36.0)
MCV: 88.5 fl (ref 78.0–100.0)
Monocytes Absolute: 0.4 10*3/uL (ref 0.1–1.0)
Monocytes Relative: 8.4 % (ref 3.0–12.0)
Neutro Abs: 3.7 10*3/uL (ref 1.4–7.7)
Neutrophils Relative %: 72.7 % (ref 43.0–77.0)
Platelets: 148 10*3/uL — ABNORMAL LOW (ref 150.0–400.0)
RBC: 4.76 Mil/uL (ref 4.22–5.81)
RDW: 13.7 % (ref 11.5–15.5)
WBC: 5 10*3/uL (ref 4.0–10.5)

## 2022-04-29 LAB — TSH: TSH: 1.7 u[IU]/mL (ref 0.35–5.50)

## 2022-04-29 LAB — LIPID PANEL
Cholesterol: 183 mg/dL (ref 0–200)
HDL: 57.9 mg/dL (ref >39.00)
LDL Cholesterol: 115 mg/dL — ABNORMAL HIGH (ref 0–99)
NonHDL: 124.61
Total CHOL/HDL Ratio: 3
Triglycerides: 49 mg/dL (ref 0.0–149.0)
VLDL: 9.8 mg/dL (ref 0.0–40.0)

## 2022-04-29 LAB — HEMOGLOBIN A1C: Hgb A1c MFr Bld: 5.5 % (ref 4.6–6.5)

## 2022-04-29 LAB — VITAMIN B12: Vitamin B-12: 1490 pg/mL — ABNORMAL HIGH (ref 211–911)

## 2022-04-29 MED ORDER — ZOSTER VAC RECOMB ADJUVANTED 50 MCG/0.5ML IM SUSR: 0.5000 mL | Freq: Once | INTRAMUSCULAR | 1 refills | Status: AC

## 2022-04-29 NOTE — Assessment & Plan Note (Signed)
He has regained 4 lbs and now following the Mediterranean diet rather than the KETO

## 2022-04-29 NOTE — Patient Instructions (Signed)
Your bone density test  been ordered.  Please call to make your appointment at [facility]    If your BMD is low  a testosterone level will be recommended

## 2022-04-29 NOTE — Assessment & Plan Note (Signed)

## 2022-04-29 NOTE — Progress Notes (Signed)
Subjective:  Patient ID: Richard Clayton, male    DOB: [birth year]  Age: 76 y.o. MRN: [id number]  CC: The primary encounter diagnosis was Hyperlipidemia with target LDL less than 160. Diagnoses of Fatigue, unspecified type, Impaired fasting glucose, Cardiomyopathy, unspecified type ([place]), B12 deficiency, Vitamin D deficiency, Osteoporosis screening, Encounter for preventive health examination, Cardiac arrhythmia, unspecified cardiac arrhythmia type, and Unintentional weight loss of 10% body weight within 6 months were also pertinent to this visit.   HPI Richard Clayton presents for  Chief Complaint  Patient presents with   Follow-up    Yearly follow up     1) cardiomyopathy:  found during workup for arrhythmia,  sent to cardiology,  ECHO abnormal with moderate pulm  HTN , elevated CVP, and  biatrial dilation,  EF 45-50% with global hypokinesis.  Myoview done in March:  abnormal, but possible fixed defect might be artifact  Has not had follow up yet with Dr End to discuss future plan. Follow up on September.    He is asymptomatic with exercise,  walking on a treadmill light weights.  Monitoring heart rate .   has eliminated caffeine   2) weight loss due to KETO diet  has resolved since stopping the KETO and following a mediterranean style diet.   LDL in feb reflects an 80 pt drop by changing diets   3)  sees dermatology ,  had  skin tag removed from face.  4) sleeping well for 5 hours falls asleep with upright tablet   5)   taking 1000 mcg B12 and 2500 vitamin D    Outpatient Medications Prior to Visit  Medication Sig Dispense Refill   cholecalciferol (VITAMIN D3) 25 MCG (1000 UNIT) tablet Take 1,000 Units by mouth daily.     Cyanocobalamin 2500 MCG SUBL Place 2,500 mcg under the tongue daily.     No facility-administered medications prior to visit.    Review of Systems;  Patient denies headache, fevers, malaise, unintentional weight loss, skin rash, eye pain, sinus congestion  and sinus pain, sore throat, dysphagia,  hemoptysis , cough, dyspnea, wheezing, chest pain, palpitations, orthopnea, edema, abdominal pain, nausea, melena, diarrhea, constipation, flank pain, dysuria, hematuria, urinary  Frequency, nocturia, numbness, tingling, seizures,  Focal weakness, Loss of consciousness,  Tremor, insomnia, depression, anxiety, and suicidal ideation.      Objective:  BP 114/66 (BP Location: Left Arm, Patient Position: Sitting, Cuff Size: Normal)   Pulse (!) 58   Temp (!) 97.4 F (36.3 C) (Oral)   Ht _0  (1.803 m)   Wt 155 lb (70.3 kg)   SpO2 98%   BMI 21.62 kg/m   BP Readings from Last 3 Encounters:  04/29/22 114/66  02/12/22 108/68  11/05/21 120/80    Wt Readings from Last 3 Encounters:  04/29/22 155 lb (70.3 kg)  02/12/22 154 lb (69.9 kg)  11/05/21 151 lb (68.5 kg)    General appearance: alert, cooperative and appears stated age Ears: normal TM's and external ear canals both ears Throat: lips, mucosa, and tongue normal; teeth and gums normal Neck: no adenopathy, no carotid bruit, supple, symmetrical, trachea midline and thyroid not enlarged, symmetric, no tenderness/mass/nodules Back: symmetric, no curvature. ROM normal. No CVA tenderness. Lungs: clear to auscultation bilaterally Heart: regular rate and rhythm, S1, S2 normal, no murmur, click, rub or gallop Abdomen: soft, non-tender; bowel sounds normal; no masses,  no organomegaly Pulses: 2+ and symmetric Skin: Skin color, texture, turgor normal. No rashes or lesions Lymph nodes: Cervical, supraclavicular,  and axillary nodes normal.  Lab Results  Component Value Date   HGBA1C 5.7 10/08/2021    Lab Results  Component Value Date   CREATININE 0.98 10/08/2021   CREATININE 0.94 04/14/2021   CREATININE 0.87 03/04/2020    Lab Results  Component Value Date   WBC 3.7 (L) 10/08/2021   HGB 13.4 10/08/2021   HCT 39.9 10/08/2021   PLT 170.0 10/08/2021   GLUCOSE 88 10/08/2021   CHOL 205 (H)  01/21/2022   TRIG 52 01/21/2022   HDL 60 01/21/2022   LDLCALC 136 (H) 01/21/2022   ALT 18 10/08/2021   AST 22 10/08/2021   NA 136 10/08/2021   K 4.4 10/08/2021   CL 100 10/08/2021   CREATININE 0.98 10/08/2021   BUN 20 10/08/2021   CO2 30 10/08/2021   TSH 1.14 10/08/2021   PSA 1.53 10/08/2021   HGBA1C 5.7 10/08/2021   MICROALBUR 0.2 12/13/2013    NM Myocar Multi W/Spect W/Wall Motion / EF  Result Date: 02/17/2022   Abnormal, probably low risk exercise myocardial perfusion stress test.   There is a small in size, moderate in severity, fixed apical defect most consistent with apical thinning but cannot exclude small area of scar.   There is no evidence of significant ischemia.   Left ventricular function is mildly reduced (LVEF 45-50%) with global hypokinesis.   There is no significant coronary artery calcification.   The patient demonstrates good exercise capacity with occasional PVCs noted before and during stress as well as in recovery.    Assessment & Plan:   Problem List Items Addressed This Visit     Cardiac arrhythmia    Referred to  cardiology , PVC's noted.  annula EKG to be done at cardiology.  Less reactivity with dc caffeine and increased control of pulse during treadmill exercise          Cardiomyopathy (HCC)    Recent ECHO noted Global hypokinesis ,  EF 45-50%  During cardiac workup.  Abnormal stress  With fixed apical defect noted on myoview . He remains asymptomatic and monitors his pulse during walks on .  Follow up with Dr End in September.        Encounter for preventive health examination    age appropriate education and counseling updated, referrals for preventative services and immunizations addressed, dietary and smoking counseling addressed, most recent labs reviewed.  I have personally reviewed and have noted:   1) the patient's medical and social history 2) The pt's use of alcohol, tobacco, and illicit drugs 3) The patient's current medications and  supplements 4) Functional ability including ADL's, fall risk, home safety risk, hearing and visual impairment 5) Diet and physical activities 6) Evidence for depression or mood disorder 7) The patient's height, weight, and BMI have been recorded in the chart 8) I have ordered and reviewed a 12 lead EKG and find that there are no acute changes and patient is in sinus rhythm.     I have made referrals, and provided counseling and education based on review of the above      Hyperlipidemia with target LDL less than 160 - Primary   Relevant Orders   Lipid Profile   Unintentional weight loss of 10% body weight within 6 months    He has regained 4 lbs and now following the Mediterranean diet rather than the KETO       Other Visit Diagnoses     Fatigue, unspecified type  Relevant Orders   CBC with Differential/Platelet   TSH   Impaired fasting glucose       Relevant Orders   Comp Met (CMET)   HgB A1c   B12 deficiency       Relevant Orders   Vitamin B12   Vitamin D deficiency       Relevant Orders   VITAMIN D 25 Hydroxy (Vit-D Deficiency, Fractures)   Osteoporosis screening       Relevant Orders   DG Bone Density       I spent a total of   31  minutes with this patient in a face to face visit on the date of this encounter reviewing the last office visit with me in May 2022 , his most recent visit with patient's cardiologist in   March., 2023,  patient's diet and eating habits, reviewing his ECHO and Myoview and explaining results to patient ,   and post visit ordering of testing and therapeutics.    Follow-up: No follow-ups on file.   Crecencio Mc, MD

## 2022-04-29 NOTE — Assessment & Plan Note (Addendum)
Recent ECHO noted Global hypokinesis ,  EF 45-50%  During cardiac workup.  Abnormal stress  With fixed apical defect noted on myoview . He remains asymptomatic and monitors his pulse during walks on .  Follow up with Dr End in September.

## 2022-04-29 NOTE — Assessment & Plan Note (Signed)
Referred to  cardiology , PVC's noted.  annula EKG to be done at cardiology.  Less reactivity with dc caffeine and increased control of pulse during treadmill exercise

## 2022-05-14 DIAGNOSIS — L821 Other seborrheic keratosis: Secondary | ICD-10-CM | POA: Diagnosis not present

## 2022-05-14 DIAGNOSIS — L905 Scar conditions and fibrosis of skin: Secondary | ICD-10-CM | POA: Diagnosis not present

## 2022-05-14 DIAGNOSIS — L814 Other melanin hyperpigmentation: Secondary | ICD-10-CM | POA: Diagnosis not present

## 2022-06-23 ENCOUNTER — Ambulatory Visit
Admission: RE | Admit: 2022-06-23 | Discharge: 2022-06-23 | Disposition: A | Discharge status: Home / Self Care | Payer: Medicare PPO | Source: Ambulatory Visit | Attending: Internal Medicine | Admitting: Internal Medicine

## 2022-06-23 DIAGNOSIS — M5136 Other intervertebral disc degeneration, lumbar region: Secondary | ICD-10-CM | POA: Insufficient documentation

## 2022-06-23 DIAGNOSIS — Z13820 Encounter for screening for osteoporosis: Secondary | ICD-10-CM | POA: Diagnosis not present

## 2022-06-23 DIAGNOSIS — E559 Vitamin D deficiency, unspecified: Secondary | ICD-10-CM | POA: Diagnosis not present

## 2022-08-14 IMAGING — CT CT ABD-PELV W/ CM
2 of 5 series · 16 of 46 positions shown, 18 images · IV contrast (APPLIED)
Comparison: None.

CLINICAL DATA: Weight loss, unintended.  Cirrhosis.

EXAM:
CT ABDOMEN AND PELVIS WITH CONTRAST
TECHNIQUE: Multidetector CT imaging of the abdomen and pelvis was performed
using the standard protocol following bolus administration of
intravenous contrast.
CONTRAST:  100mL OMNIPAQUE IOHEXOL 300 MG/ML  SOLN

[Series 2: routine abd/pel with · axial · 0.78mm/px · z∈[-790,-335]mm · 13 of 103 slices shown, 15 images]
[im 6/103  soft-tissue]
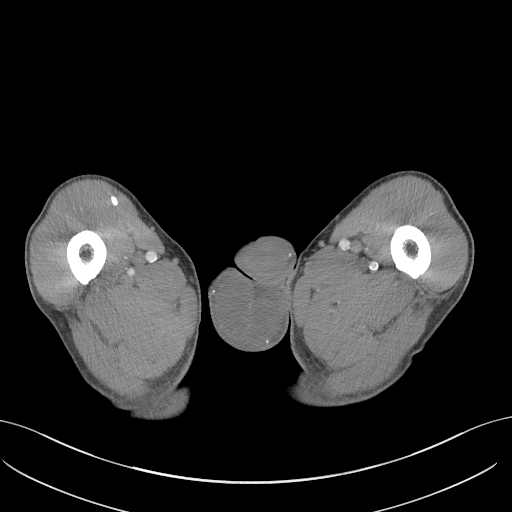
[im 6/103  bone]
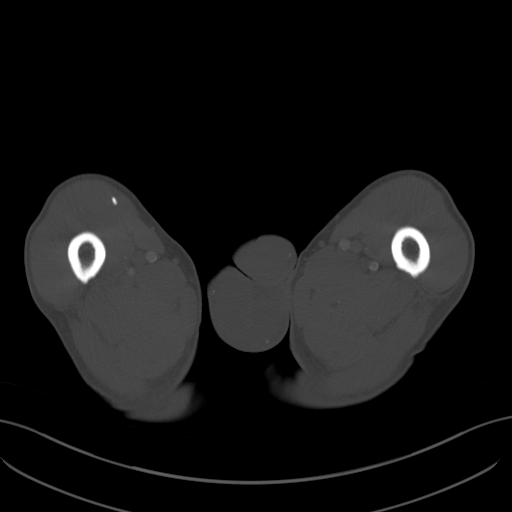
[im 17/103  soft-tissue]
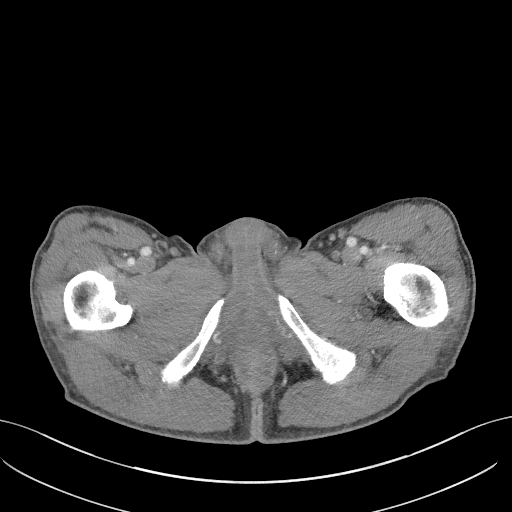
[im 22/103  soft-tissue]
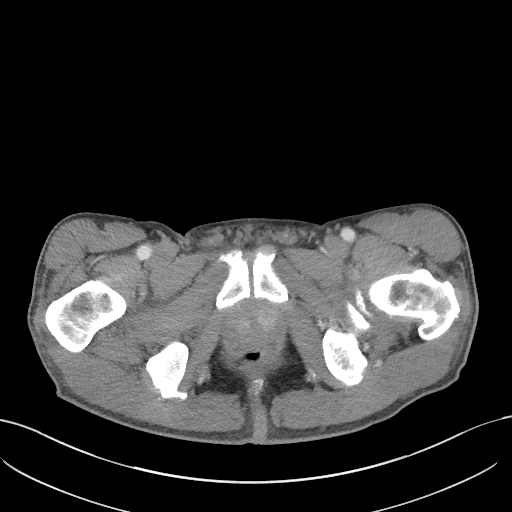
[im 27/103  soft-tissue]
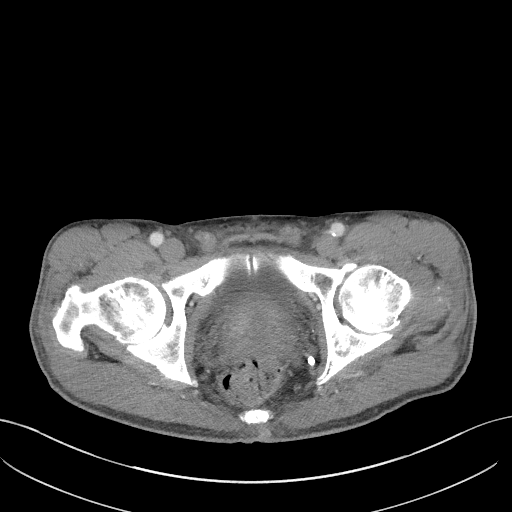
[im 38/103  soft-tissue]
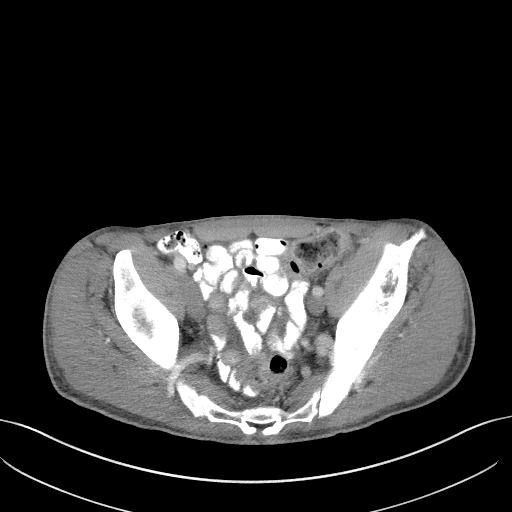
[im 43/103  soft-tissue]
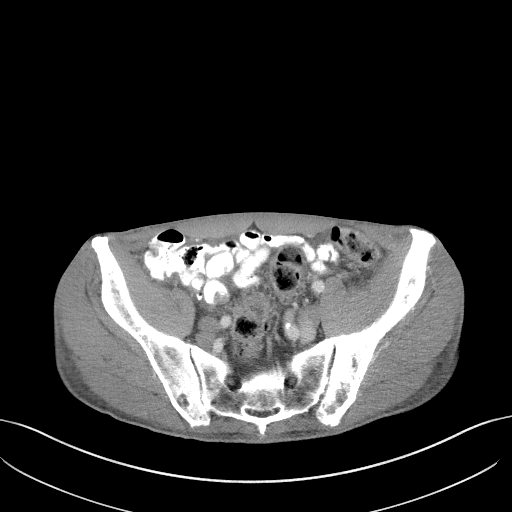
[im 54/103  soft-tissue]
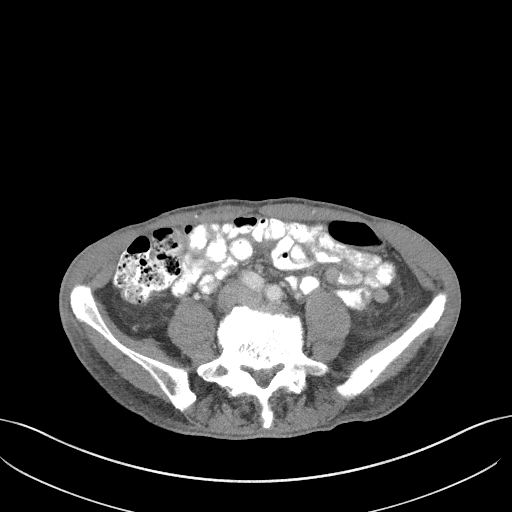
[im 60/103  soft-tissue]
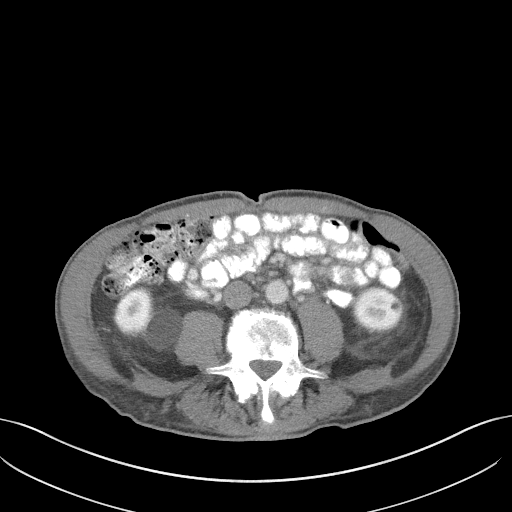
[im 65/103  soft-tissue]
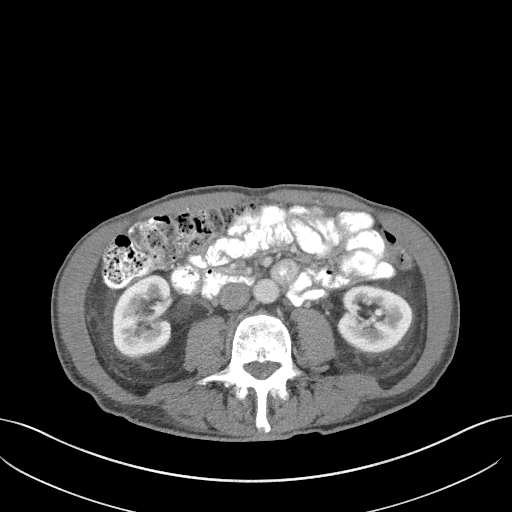
[im 65/103  bone]
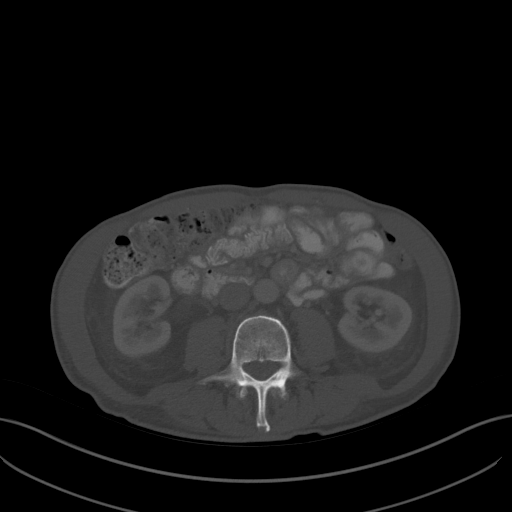
[im 76/103  soft-tissue]
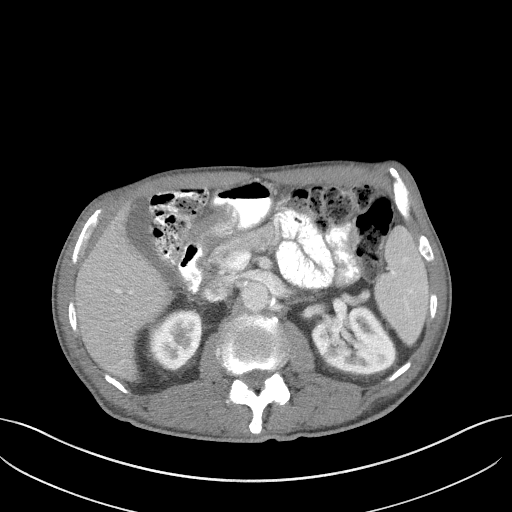
[im 81/103  soft-tissue]
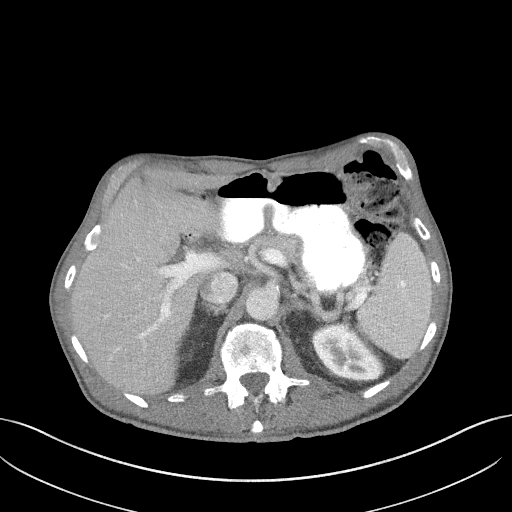
[im 86/103  soft-tissue]
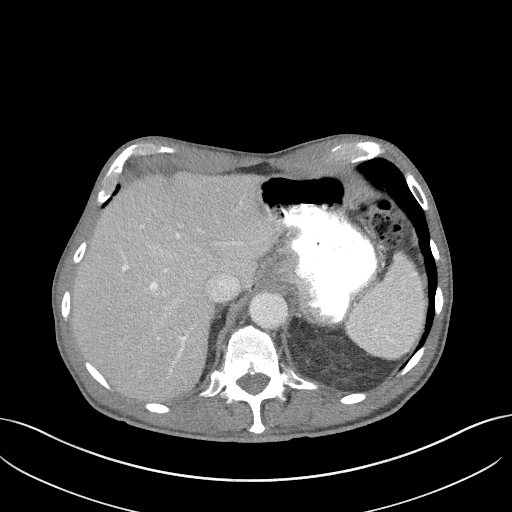
[im 97/103  soft-tissue]
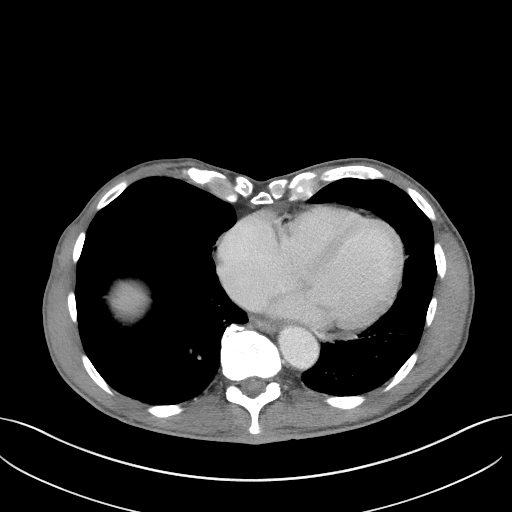

[Series 5: coronal st · coronal · 0.79mm/px · 3 of 83 slices shown]
[im 28/83  soft-tissue]
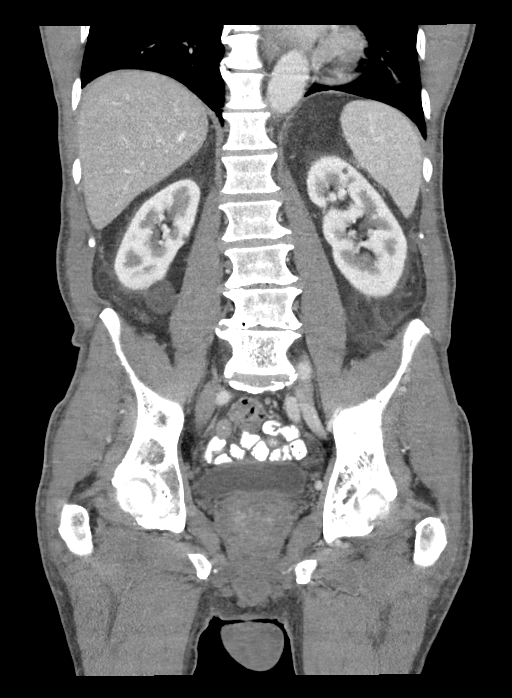
[im 37/83  soft-tissue]
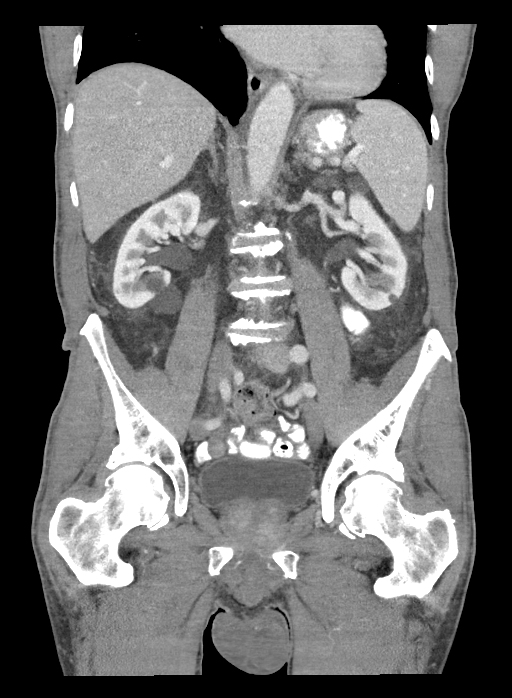
[im 46/83  soft-tissue]
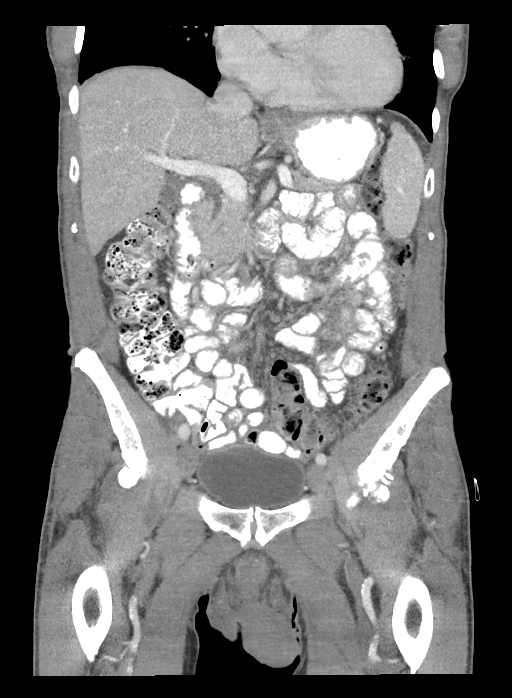

[16 of 46 positions shown; findings below may reference images not displayed]

FINDINGS: Lower chest: No acute abnormality.

Hepatobiliary: Small hypodensity in the right hepatic dome, likely
reflect small cyst, 5 mm. Gallbladder unremarkable. No biliary
ductal dilatation. No CT evidence of cirrhosis.

Pancreas: No focal abnormality or ductal dilatation.

Spleen: No focal abnormality.  Normal size.

Adrenals/Urinary Tract: Numerous bilateral renal cysts, largest on
the right in the lower pole measuring 2.8 cm and on the left in the
upper pole measuring 2 cm. No hydronephrosis. Adrenal glands and
urinary bladder unremarkable.

Stomach/Bowel: Sigmoid diverticulosis. No active diverticulitis.
Stomach and small bowel decompressed, unremarkable. Normal appendix.
Moderate stool burden.

Vascular/Lymphatic: Aortic atherosclerosis. No evidence of aneurysm
or adenopathy.

Reproductive: Mildly prominent prostate

Other: No free fluid or free air.

Musculoskeletal: Degenerative changes in the hips and lumbar spine.
Sclerosis and cortical thickening noted throughout the left
hemipelvis, most notable in the iliac bone suggests Paget's disease.
IMPRESSION: Bilateral renal cysts.

No CT evidence of cirrhosis.

Sigmoid diverticulosis.  Moderate stool burden throughout the colon.

Mild prostate enlargement.

No acute findings in the abdomen or pelvis.

Sclerosis/cortical thickening throughout the left iliac bone
suggests Paget's disease.

## 2022-08-20 ENCOUNTER — Ambulatory Visit: Payer: Medicare PPO | Attending: Internal Medicine | Admitting: Internal Medicine

## 2022-08-20 ENCOUNTER — Encounter: Payer: Self-pay | Admitting: Internal Medicine

## 2022-08-20 VITALS — BP 120/78 | HR 68 | Ht 71.0 in | Wt 156.0 lb

## 2022-08-20 DIAGNOSIS — I493 Ventricular premature depolarization: Secondary | ICD-10-CM | POA: Diagnosis not present

## 2022-08-20 DIAGNOSIS — I7121 Aneurysm of the ascending aorta, without rupture: Secondary | ICD-10-CM

## 2022-08-20 DIAGNOSIS — I428 Other cardiomyopathies: Secondary | ICD-10-CM | POA: Diagnosis not present

## 2022-08-20 DIAGNOSIS — E7800 Pure hypercholesterolemia, unspecified: Secondary | ICD-10-CM

## 2022-08-20 NOTE — Patient Instructions (Signed)
Medication Instructions:  Your Physician recommend you continue on your current medication as directed.    *If you need a refill on your cardiac medications before your next appointment, please call your pharmacy*   Lab Work: None ordered today   Testing/Procedures: Your physician has requested that you have an echocardiogram in January, 2024 . Echocardiography is a painless test that uses sound waves to create images of your heart. It provides your doctor with information about the size and shape of your heart and how well your heart's chambers and valves are working. This procedure takes approximately one hour. There are no restrictions for this procedure. [place] [address] [place] [address]    Follow-Up: At [facility], you and your health needs are our priority.  As part of our continuing mission to provide you with exceptional heart care, we have created designated Provider Care Teams.  These Care Teams include your primary Cardiologist (physician) and Advanced Practice Providers (APPs -  Physician Assistants and Nurse Practitioners) who all work together to provide you with the care you need, when you need it.  We recommend signing up for the patient portal called "MyChart".  Sign up information is provided on this After Visit Summary.  MyChart is used to connect with patients for Virtual Visits (Telemedicine).  Patients are able to view lab/test results, encounter notes, upcoming appointments, etc.  Non-urgent messages can be sent to your provider as well.   To learn more about what you can do with MyChart, go to [email or web address].    Your next appointment:   4 month(s) (after ECHO)  The format for your next appointment:   In Person  Provider:   Nelva Bush, MD

## 2022-08-20 NOTE — Progress Notes (Signed)
Follow-up Outpatient Visit Date: 08/20/2022  Primary Care Provider: Crecencio Mc, MD [place] [address]  Chief Complaint: Follow-up cardiomyopathy and PVCs  HPI:  Mr. Zappia is a 76 y.o. male with history of PVCs, cardiomyopathy with mildly reduced LVEF and moderate pulmonary hypertension by echocardiogram (11/2021), hyperlipidemia, dilated aortic root stable by CT greater than 10 years, and cirrhosis, who presents for follow-up of arrhythmia.  I last saw him in March, at which time he was feeling well.  Due to cardiomyopathy and PVCs, we agreed to perform an exercise MPI.  The study was abnormal, probably low risk with a small fixed defect at the apex most consistent with apical thinning though a small area of scar could not be excluded.  No significant coronary artery calcification was observed.  Today, Mr. Vallone reports that he is been feeling well.  He denies palpitations as well as chest pain, shortness of breath, lightheadedness, and edema.  He is exercising regularly and has also significantly reduced his caffeine consumption.  --------------------------------------------------------------------------------------------------  Cardiovascular History & Procedures: Cardiovascular Problems: PVC's and questionable atrial fibrillation   Risk Factors: Hyperlipidemia, male gender, prior tobacco use, and age greater than 32   Cath/PCI: None   CV Surgery: None   EP Procedures and Devices: None   Non-Invasive Evaluation(s): Exercise MPI (02/17/2022): Abnormal, probably low risk myocardial perfusion stress test with small in size, moderate in severity, fixed apical defect most consistent with apical thinning but cannot exclude small area of scar.  LVEF 45-50% with global hypokinesis.  No significant coronary artery calcification noted.  Occasional PVCs observed during stress and recovery. TTE (12/11/2021): Normal LV size and wall thickness.  LVEF 45-50%  with grade 1 diastolic dysfunction.  Normal RV size and function.  Moderate pulmonary hypertension.  Moderate biatrial enlargement.  No pericardial effusion.  Mild MR.  Mild-moderate TR.  Mild AI.  CVP 15 mmHg. Coronary calcium score (11/07/2018): Coronary calcium score 5.9 (22nd percentile for age and sex matched control).  Mild dilation of ascending aorta up to 4.3 cm noted.  Recent CV Pertinent Labs: Lab Results  Component Value Date   CHOL 183 04/29/2022   CHOL 205 (H) 01/21/2022   HDL 57.90 04/29/2022   HDL 60 01/21/2022   LDLCALC 115 (H) 04/29/2022   LDLCALC 136 (H) 01/21/2022   TRIG 49.0 04/29/2022   CHOLHDL 3 04/29/2022   K 4.1 04/29/2022   MG 2.0 10/08/2021   BUN 21 04/29/2022   CREATININE 0.92 04/29/2022    Past medical and surgical history were reviewed and updated in EPIC.  Current Meds  Medication Sig   cholecalciferol (VITAMIN D3) 25 MCG (1000 UNIT) tablet Take 1,000 Units by mouth daily.   Cyanocobalamin 2500 MCG SUBL Place 2,500 mcg under the tongue daily.    Allergies: Statins  Social History   Tobacco Use   Smoking status: Former    Packs/day: 1.00    Types: Cigarettes    Quit date: 12/13/1968    Years since quitting: 53.7   Smokeless tobacco: Never  Vaping Use   Vaping Use: Never used  Substance Use Topics   Alcohol use: Yes    Comment: 1 glass of wine per 3 months   Drug use: No    Family History  Problem Relation Age of Onset   Hyperlipidemia Mother    Cancer Father 2       from working in [place] Sister 72  in remission   Coronary artery disease Brother 85       s/p stent    Review of Systems: A 12-system review of systems was performed and was negative except as noted in the HPI.  --------------------------------------------------------------------------------------------------  Physical Exam: BP 120/78 (BP Location: Left Arm, Patient Position: Sitting, Cuff Size: Normal)   Pulse 68   Ht 5\' 11"  (1.803 m)   Wt  156 lb (70.8 kg)   SpO2 98%   BMI 21.76 kg/m   General:  NAD. Neck: No JVD or HJR. Lungs: Clear to auscultation bilaterally without wheezes or crackles. Heart: Regular rate and rhythm without murmurs, rubs, or gallops. Abdomen: Soft, nontender, nondistended. Extremities: No lower extremity edema.  EKG: Normal sinus rhythm with nonspecific T wave changes.  Compared with prior tracing from 11/05/2021, PVCs are no longer present.  PR interval has also shortened.  Lab Results  Component Value Date   WBC 5.0 04/29/2022   HGB 14.2 04/29/2022   HCT 42.2 04/29/2022   MCV 88.5 04/29/2022   PLT 148.0 (L) 04/29/2022    Lab Results  Component Value Date   NA 141 04/29/2022   K 4.1 04/29/2022   CL 105 04/29/2022   CO2 29 04/29/2022   BUN 21 04/29/2022   CREATININE 0.92 04/29/2022   GLUCOSE 90 04/29/2022   ALT 19 04/29/2022    Lab Results  Component Value Date   CHOL 183 04/29/2022   HDL 57.90 04/29/2022   LDLCALC 115 (H) 04/29/2022   TRIG 49.0 04/29/2022   CHOLHDL 3 04/29/2022    --------------------------------------------------------------------------------------------------  ASSESSMENT AND PLAN: Nonischemic cardiomyopathy: Mr. Briski continues to feel well with NYHA class I symptoms.  Most recent echo in 11/2021 showed mildly reduced LVEF of 45-50%.  Moderate pulmonary hypertension was also noted.  We have discussed goal-directed medical therapy for management of his nonischemic cardiomyopathy, though Mr. 12/2021 continues to decline medications.  We have agreed to repeat an echocardiogram in January.  If his LVEF and PA pressures have not improved, we will need to consider further testing and initiation of GDMT.  PVCs: No PVCs noted on EKG today.  Mr. Goodley has never had palpitations, so assessing PVC burden that way is challenging.  We discussed the role for ambulatory cardiac monitoring, which Mr. Blahnik does not wish to pursue at this time.  Thoracic aortic  aneurysm: Mild dilation of the ascending thoracic aorta previously noted, relatively stable at 4.3 cm on attenuation correction CT from 01/2022.  This has been stable for more than a decade.  Hyperlipidemia: LDL mildly elevated on last check in May.  Mr. Testa wishes to continue to defer statin therapy in favor of lifestyle modifications.  Follow-up: Return to clinic shortly after echocardiogram in 11/2022.  12/2022, MD 08/20/2022 9:48 AM

## 2022-08-24 ENCOUNTER — Telehealth: Payer: Self-pay | Admitting: *Deleted

## 2022-08-24 ENCOUNTER — Encounter: Payer: Self-pay | Admitting: Internal Medicine

## 2022-08-24 NOTE — Telephone Encounter (Signed)
Scheduling- do you mind sending the patient a copy of his EKG once scanned.   Thank you!

## 2022-08-24 NOTE — Telephone Encounter (Signed)
Hi Sal,    Your EKG from 08/20/22 has been sent to our medical records to be scanned into your chart so I do not have access to a copy at this time. Once they upload it we can get you a copy.    Thanks,  Carly  Last read by Dalphine Handing "Sal" at  9:47 AM on 08/24/2022. Zakee Sen "Sal" to P [facility] Triage (supporting Nelva Bush, MD)       08/24/22  9:40 AM Can you please send me a copy of the EKG 12-Lead graph done on 9-21 23 as you did with the 11-17-21 visit.  I would like to have it for my records.  Thank You Sal Negro

## 2022-09-16 ENCOUNTER — Telehealth: Payer: Self-pay | Admitting: Internal Medicine

## 2022-09-16 NOTE — Telephone Encounter (Signed)
Spoke with patient he declined AWV do not call °

## 2022-09-30 DIAGNOSIS — Z961 Presence of intraocular lens: Secondary | ICD-10-CM | POA: Diagnosis not present

## 2022-12-09 ENCOUNTER — Ambulatory Visit: Payer: Medicare PPO | Attending: Internal Medicine

## 2022-12-09 DIAGNOSIS — I428 Other cardiomyopathies: Secondary | ICD-10-CM | POA: Diagnosis not present

## 2022-12-09 LAB — ECHOCARDIOGRAM COMPLETE
AR max vel: 2.24 cm2
AV Area VTI: 2.51 cm2
AV Area mean vel: 2.2 cm2
AV Mean grad: 4 mmHg
AV Peak grad: 6.3 mmHg
AV Vena cont: 0.5 cm
Ao pk vel: 1.25 m/s
Area-P 1/2: 3.93 cm2
Calc EF: 26.8 %
S' Lateral: 4.6 cm
Single Plane A2C EF: 28.7 %
Single Plane A4C EF: 23.7 %

## 2022-12-09 MED ORDER — PERFLUTREN LIPID MICROSPHERE
1.0000 mL | INTRAVENOUS | Status: AC | PRN
  Administered 2022-12-09: 2 mL via INTRAVENOUS

## 2022-12-16 ENCOUNTER — Ambulatory Visit: Payer: Medicare PPO | Admitting: Internal Medicine

## 2022-12-30 NOTE — Progress Notes (Unsigned)
Follow-up Outpatient Visit Date: 12/31/2022  Primary Care Provider: Crecencio Mc, MD [place] [address]  Chief Complaint: Follow-up cardiomyopathy   HPI:  Mr. Traywick is a 77 y.o. male with history of PVCs, cardiomyopathy with mildly reduced LVEF and moderate pulmonary hypertension by echocardiogram (11/2021), hyperlipidemia, dilated aortic root stable by CT greater than 10 years, and cirrhosis, who presents for follow-up of PVCs and heart failure with mildly reduced ejection fraction.  I last saw him in September, at which time he was feeling well.  We did not make any medication changes, as Mr. Fullwood declined addition of goal-directed medical therapy for treatment of his cardiomyopathy.  We agreed to repeat an echocardiogram, which was performed last month and showed interval decline in systolic function with LVEF of 30-35%.  Today, Mr. Frayre reports that he continues to feel well though he is discouraged by his recent decline in LVEF.  He believes that to vigorous exercise as well as excess sodium and fluid intake may have contributed to this.  He had also stopped consuming caffeine altogether and wonders if resuming caffeine may be helpful (he is now back to drinking at least a couple of cups of coffee a day).  He denies chest pain, shortness of breath, palpitations, lightheadedness, and edema.  Home blood pressures have been 108/68-112/70.  He has found that his resting heart rate is usually in the low 60s.  --------------------------------------------------------------------------------------------------  Cardiovascular History & Procedures: Cardiovascular Problems: PVC's and questionable atrial fibrillation Cardiomyopathy   Risk Factors: Hyperlipidemia, male gender, prior tobacco use, and age greater than 67   Cath/PCI: None   CV Surgery: None   EP Procedures and Devices: None   Non-Invasive Evaluation(s): TTE (12/09/2022): Normal LV size  and wall thickness.  LVEF 30-35% with global hypokinesis.  LV diastolic parameters were indeterminate.  Normal RV size and function.  Moderate left and severe right atrial enlargement.  No pericardial effusion.  Mild MR.  Mild-moderate TR.  Tricuspid aortic valve without stenosis.  Mild AI noted.  Mildly dilated ascending aorta, measuring 4.3 cm.  Normal CVP. Exercise MPI (02/17/2022): Abnormal, probably low risk myocardial perfusion stress test with small in size, moderate in severity, fixed apical defect most consistent with apical thinning but cannot exclude small area of scar.  LVEF 45-50% with global hypokinesis.  No significant coronary artery calcification noted.  Occasional PVCs observed during stress and recovery. TTE (12/11/2021): Normal LV size and wall thickness.  LVEF 45-50% with grade 1 diastolic dysfunction.  Normal RV size and function.  Moderate pulmonary hypertension.  Moderate biatrial enlargement.  No pericardial effusion.  Mild MR.  Mild-moderate TR.  Mild AI.  CVP 15 mmHg. Coronary calcium score (11/07/2018): Coronary calcium score 5.9 (22nd percentile for age and sex matched control).  Mild dilation of ascending aorta up to 4.3 cm noted.  Recent CV Pertinent Labs: Lab Results  Component Value Date   CHOL 183 04/29/2022   CHOL 205 (H) 01/21/2022   HDL 57.90 04/29/2022   HDL 60 01/21/2022   LDLCALC 115 (H) 04/29/2022   LDLCALC 136 (H) 01/21/2022   TRIG 49.0 04/29/2022   CHOLHDL 3 04/29/2022   K 4.1 04/29/2022   MG 2.0 10/08/2021   BUN 21 04/29/2022   CREATININE 0.92 04/29/2022    Past medical and surgical history were reviewed and updated in EPIC.  Current Meds  Medication Sig   Ascorbic Acid (VITAMIN C) 1000 MG tablet Take 1,000 mg by mouth daily.  cholecalciferol (VITAMIN D3) 25 MCG (1000 UNIT) tablet Take 4,000 Units by mouth daily.   Cyanocobalamin 2500 MCG SUBL Place 2,500 mcg under the tongue daily.   vitamin E 180 MG (400 UNITS) capsule Take 400 Units by mouth  daily.    Allergies: Statins  Social History   Tobacco Use   Smoking status: Former    Packs/day: 1.00    Types: Cigarettes    Quit date: 12/13/1968    Years since quitting: 54.0   Smokeless tobacco: Never  Vaping Use   Vaping Use: Never used  Substance Use Topics   Alcohol use: Yes    Comment: 1 glass of wine per 3 months   Drug use: No    Family History  Problem Relation Age of Onset   Hyperlipidemia Mother    Cancer Father 77       from working in [place] Sister 51       in remission   Coronary artery disease Brother 68       s/p stent    Review of Systems: A 12-system review of systems was performed and was negative except as noted in the HPI.  --------------------------------------------------------------------------------------------------  Physical Exam: BP 112/70 (BP Location: Left Arm, Patient Position: Sitting)   Pulse 77   Ht 5\' 11"  (1.803 m)   Wt 159 lb (72.1 kg)   SpO2 97%   BMI 22.18 kg/m   General:  NAD. Neck: No JVD or HJR. Lungs: Clear to auscultation bilaterally without wheezes or crackles. Heart: Regular rate and rhythm without murmurs, rubs, or gallops. Abdomen: Soft, nontender, nondistended. Extremities: No lower extremity edema.  EKG: Normal sinus rhythm with first-degree AV block (PR interval 264 ms) and nonspecific T wave changes.  Compared with prior tracing from 08/20/2022, PR interval has increased.  Lab Results  Component Value Date   WBC 5.0 04/29/2022   HGB 14.2 04/29/2022   HCT 42.2 04/29/2022   MCV 88.5 04/29/2022   PLT 148.0 (L) 04/29/2022    Lab Results  Component Value Date   NA 141 04/29/2022   K 4.1 04/29/2022   CL 105 04/29/2022   CO2 29 04/29/2022   BUN 21 04/29/2022   CREATININE 0.92 04/29/2022   GLUCOSE 90 04/29/2022   ALT 19 04/29/2022    Lab Results  Component Value Date   CHOL 183 04/29/2022   HDL 57.90 04/29/2022   LDLCALC 115 (H) 04/29/2022   TRIG 49.0 04/29/2022   CHOLHDL 3  04/29/2022    --------------------------------------------------------------------------------------------------  ASSESSMENT AND PLAN: HFrEF and cardiomyopathy of uncertain etiology: Mr. Maffeo feels well, his left ventricular systolic function has decreased significantly over the past year.  Recent echocardiogram showed an EF of 30-35%, though on my review it may even be a little bit lower.  He does not have any signs or symptoms of overt heart failure consistent with NYHA class I.  We discussed importance of distinguishing ischemic versus nonischemic cardiomyopathy; we presume his cardiomyopathy to be nonischemic given low risk stress test in the past, as recently as 02/2022.  Nonetheless, I have recommended that we proceed with right and left heart catheterization to definitively assess his coronary anatomy as well as filling pressures.  Mr. Michalowski does not wish to move forward with catheterization at this time.  We also discussed the natural history of heart failure with severely reduced LVEF and the rationale behind goal-directed medical therapy to help improve LVEF and lessen risk for major adverse cardiac events.  Mr. Weare does not wish to add any medications at this time and instead focus on lifestyle modifications.  We have agreed to have him wear a 72-hour event monitor to assess his PVC burden, as he has been noted to have fairly frequent PVCs in the past on EKGs and would be at risk for a PVC associated cardiomyopathy if his PVC burden is greater than 20%.  I encouraged him to continue minimizing his sodium and fluid intake.  We will repeat a limited echo to reassess his LVEF shortly before our follow-up visit in 3 months.  First-degree AV block: PR interval is a little longer on today's EKG compared with 07/2022.  Will continue to monitor this clinically and avoid AV nodal blocking agents, if possible.  Mr. Bohlken does not have any symptoms of high-grade AV block.  PVC's: No PVCs noted on  today's tracing, though he has has had fairly frequent ventricular ectopy in the past.  We have agreed to a 72-hour event monitor to quantify his PVC burden in the setting of worsening cardiomyopathy.  Thoracic aortic aneurysm: Recent echo showed mild dilation of the ascending aorta of up to 4.3 cm, which has been stable for at least a decade.  Defer further workup at this time.  Mr. Aston has previously declined statin therapy and again wishes to focus on lifestyle modifications.  Return to clinic in 3 months with limited echo shortly before visit.  Nelva Bush, MD 12/31/2022 11:11 AM

## 2022-12-31 ENCOUNTER — Encounter: Payer: Self-pay | Admitting: Internal Medicine

## 2022-12-31 ENCOUNTER — Ambulatory Visit: Payer: Medicare PPO | Attending: Internal Medicine | Admitting: Internal Medicine

## 2022-12-31 ENCOUNTER — Ambulatory Visit (INDEPENDENT_AMBULATORY_CARE_PROVIDER_SITE_OTHER): Payer: Medicare PPO

## 2022-12-31 VITALS — BP 112/70 | HR 72 | Ht 71.0 in | Wt 159.0 lb

## 2022-12-31 DIAGNOSIS — I429 Cardiomyopathy, unspecified: Secondary | ICD-10-CM

## 2022-12-31 DIAGNOSIS — I440 Atrioventricular block, first degree: Secondary | ICD-10-CM | POA: Diagnosis not present

## 2022-12-31 DIAGNOSIS — I493 Ventricular premature depolarization: Secondary | ICD-10-CM

## 2022-12-31 DIAGNOSIS — I5020 Unspecified systolic (congestive) heart failure: Secondary | ICD-10-CM | POA: Diagnosis not present

## 2022-12-31 DIAGNOSIS — I428 Other cardiomyopathies: Secondary | ICD-10-CM

## 2022-12-31 DIAGNOSIS — I7121 Aneurysm of the ascending aorta, without rupture: Secondary | ICD-10-CM | POA: Diagnosis not present

## 2022-12-31 NOTE — Patient Instructions (Addendum)
Medication Instructions:  Your Physician recommend you continue on your current medication as directed.    *If you need a refill on your cardiac medications before your next appointment, please call your pharmacy*   Lab Work: None ordered today   Testing/Procedures: Your physician has requested that you have an limited echocardiogram prior to 3 month follow up. Echocardiography is a painless test that uses sound waves to create images of your heart. It provides your doctor with information about the size and shape of your heart and how well your heart's chambers and valves are working.   You may receive an ultrasound enhancing agent through an IV if needed to better visualize your heart during the echo. This procedure takes approximately one hour.  There are no restrictions for this procedure.  This will take place at [facility] [id number], [place]   Your physician has recommended that you wear a 3 day Zio monitor.   This monitor is a medical device that records the heart's electrical activity. Doctors most often use these monitors to diagnose arrhythmias. Arrhythmias are problems with the speed or rhythm of the heartbeat. The monitor is a small device applied to your chest. You can wear one while you do your normal daily activities. While wearing this monitor if you have any symptoms to push the button and record what you felt. Once you have worn this monitor for the period of time provider prescribed (Usually 14 days), you will return the monitor device in the postage paid box. Once it is returned they will download the data collected and provide [place] with a report which the provider will then review and we will call you with those results. Important tips:  Avoid showering during the first 24 hours of wearing the monitor. Avoid excessive sweating to help maximize wear time. Do not submerge the device, no hot tubs, and no swimming pools. Keep any lotions  or oils away from the patch. After 24 hours you may shower with the patch on. Take brief showers with your back facing the shower head.  Do not remove patch once it has been placed because that will interrupt data and decrease adhesive wear time. Push the button when you have any symptoms and write down what you were feeling. Once you have completed wearing your monitor, remove and place into box which has postage paid and place in your outgoing mailbox.  If for some reason you have misplaced your box then call our office and we can provide another box and/or mail it off for you.     Follow-Up: At [facility], you and your health needs are our priority.  As part of our continuing mission to provide you with exceptional heart care, we have created designated Provider Care Teams.  These Care Teams include your primary Cardiologist (physician) and Advanced Practice Providers (APPs -  Physician Assistants and Nurse Practitioners) who all work together to provide you with the care you need, when you need it.  We recommend signing up for the patient portal called "MyChart".  Sign up information is provided on this After Visit Summary.  MyChart is used to connect with patients for Virtual Visits (Telemedicine).  Patients are able to view lab/test results, encounter notes, upcoming appointments, etc.  Non-urgent messages can be sent to your provider as well.   To learn more about what you can do with MyChart, go to [email or web address].    Your next appointment:   3 month(s)  Provider:   You may see Nelva Bush, MD or one of the following Advanced Practice Providers on your designated Care Team:   Murray Hodgkins, NP Christell Faith, PA-C Cadence Kathlen Mody, PA-C Gerrie Nordmann, NP

## 2023-01-08 DIAGNOSIS — I428 Other cardiomyopathies: Secondary | ICD-10-CM

## 2023-01-08 DIAGNOSIS — I493 Ventricular premature depolarization: Secondary | ICD-10-CM | POA: Diagnosis not present

## 2023-01-19 DIAGNOSIS — I493 Ventricular premature depolarization: Secondary | ICD-10-CM | POA: Diagnosis not present

## 2023-01-19 DIAGNOSIS — I428 Other cardiomyopathies: Secondary | ICD-10-CM | POA: Diagnosis not present

## 2023-03-24 ENCOUNTER — Ambulatory Visit: Payer: Medicare PPO | Admitting: Internal Medicine

## 2023-03-31 ENCOUNTER — Ambulatory Visit: Payer: Medicare PPO | Attending: Internal Medicine

## 2023-03-31 DIAGNOSIS — I493 Ventricular premature depolarization: Secondary | ICD-10-CM | POA: Diagnosis not present

## 2023-03-31 DIAGNOSIS — I082 Rheumatic disorders of both aortic and tricuspid valves: Secondary | ICD-10-CM | POA: Diagnosis not present

## 2023-03-31 DIAGNOSIS — I429 Cardiomyopathy, unspecified: Secondary | ICD-10-CM

## 2023-03-31 DIAGNOSIS — I77810 Thoracic aortic ectasia: Secondary | ICD-10-CM | POA: Diagnosis not present

## 2023-03-31 LAB — ECHOCARDIOGRAM LIMITED
AR max vel: 2.73 cm2
AV Area VTI: 2.61 cm2
AV Area mean vel: 2.59 cm2
AV Mean grad: 4 mmHg
AV Peak grad: 7 mmHg
Ao pk vel: 1.32 m/s
P 1/2 time: 579 msec
S' Lateral: 4.5 cm

## 2023-04-02 ENCOUNTER — Telehealth: Payer: Self-pay | Admitting: Internal Medicine

## 2023-04-02 NOTE — Telephone Encounter (Signed)
I spoke with Mr. Reichenberger regarding the results of his recent echo.  I reviewed images from this study and prior echo in January side-by-side.  I do not appreciate a significant change.  Mr. Barrientes points out that calculated 3D-LVEF has increased from 37%->44%.  I estimate EF on both studies to be around 40%.  I again recommended consideration of repeat ischemia evaluation and initiation of goal-directed medical therapy.  Mr. Wanek declines and wishes to follow-up with me in 6 months.  I encouraged him to reach out to our schedulers at his convenience to arrange for this.  We discussed role for repeating an echo; I advised him that this would only be useful if he were willing to initiate medical therapy or undergo further testing if his LVEF does not improve or worsens (or symptoms develop).  We will readdress this at his next office visit.  Yvonne Kendall, MD [facility]

## 2023-04-06 ENCOUNTER — Ambulatory Visit: Payer: Medicare PPO | Admitting: Physician Assistant

## 2023-04-14 ENCOUNTER — Ambulatory Visit: Payer: Medicare PPO | Admitting: Internal Medicine

## 2023-05-20 DIAGNOSIS — H903 Sensorineural hearing loss, bilateral: Secondary | ICD-10-CM | POA: Diagnosis not present

## 2023-05-21 DIAGNOSIS — L821 Other seborrheic keratosis: Secondary | ICD-10-CM | POA: Diagnosis not present

## 2023-05-21 DIAGNOSIS — L905 Scar conditions and fibrosis of skin: Secondary | ICD-10-CM | POA: Diagnosis not present

## 2023-05-21 DIAGNOSIS — L814 Other melanin hyperpigmentation: Secondary | ICD-10-CM | POA: Diagnosis not present

## 2023-05-21 DIAGNOSIS — L570 Actinic keratosis: Secondary | ICD-10-CM | POA: Diagnosis not present

## 2023-05-21 DIAGNOSIS — D1801 Hemangioma of skin and subcutaneous tissue: Secondary | ICD-10-CM | POA: Diagnosis not present

## 2023-05-21 DIAGNOSIS — D485 Neoplasm of uncertain behavior of skin: Secondary | ICD-10-CM | POA: Diagnosis not present

## 2023-05-25 ENCOUNTER — Ambulatory Visit (INDEPENDENT_AMBULATORY_CARE_PROVIDER_SITE_OTHER): Payer: Medicare PPO | Admitting: Internal Medicine

## 2023-05-25 ENCOUNTER — Encounter: Payer: Self-pay | Admitting: Internal Medicine

## 2023-05-25 VITALS — BP 122/80 | HR 64 | Temp 97.7°F | Ht 71.0 in | Wt 155.4 lb

## 2023-05-25 DIAGNOSIS — I5020 Unspecified systolic (congestive) heart failure: Secondary | ICD-10-CM

## 2023-05-25 DIAGNOSIS — I428 Other cardiomyopathies: Secondary | ICD-10-CM | POA: Diagnosis not present

## 2023-05-25 DIAGNOSIS — Z0001 Encounter for general adult medical examination with abnormal findings: Secondary | ICD-10-CM

## 2023-05-25 DIAGNOSIS — R5383 Other fatigue: Secondary | ICD-10-CM

## 2023-05-25 DIAGNOSIS — E785 Hyperlipidemia, unspecified: Secondary | ICD-10-CM

## 2023-05-25 DIAGNOSIS — R7301 Impaired fasting glucose: Secondary | ICD-10-CM

## 2023-05-25 DIAGNOSIS — Z0000 Encounter for general adult medical examination without abnormal findings: Secondary | ICD-10-CM

## 2023-05-25 LAB — COMPREHENSIVE METABOLIC PANEL
ALT: 16 U/L (ref 0–53)
AST: 20 U/L (ref 0–37)
Albumin: 4.3 g/dL (ref 3.5–5.2)
Alkaline Phosphatase: 163 U/L — ABNORMAL HIGH (ref 39–117)
BUN: 19 mg/dL (ref 6–23)
CO2: 30 mEq/L (ref 19–32)
Calcium: 9.8 mg/dL (ref 8.4–10.5)
Chloride: 101 mEq/L (ref 96–112)
Creatinine, Ser: 0.84 mg/dL (ref 0.40–1.50)
GFR: 83.85 mL/min (ref >60.00)
Glucose, Bld: 91 mg/dL (ref 70–99)
Potassium: 3.8 mEq/L (ref 3.5–5.1)
Sodium: 139 mEq/L (ref 135–145)
Total Bilirubin: 0.9 mg/dL (ref 0.2–1.2)
Total Protein: 7.1 g/dL (ref 6.0–8.3)

## 2023-05-25 LAB — HEMOGLOBIN A1C: Hgb A1c MFr Bld: 5.5 % (ref 4.6–6.5)

## 2023-05-25 LAB — CBC WITH DIFFERENTIAL/PLATELET
Basophils Absolute: 0.1 10*3/uL (ref 0.0–0.1)
Basophils Relative: 1.4 % (ref 0.0–3.0)
Eosinophils Absolute: 0.2 10*3/uL (ref 0.0–0.7)
Eosinophils Relative: 4.3 % (ref 0.0–5.0)
HCT: 42.1 % (ref 39.0–52.0)
Hemoglobin: 14.2 g/dL (ref 13.0–17.0)
Lymphocytes Relative: 18.7 % (ref 12.0–46.0)
Lymphs Abs: 0.9 10*3/uL (ref 0.7–4.0)
MCHC: 33.6 g/dL (ref 30.0–36.0)
MCV: 88.7 fl (ref 78.0–100.0)
Monocytes Absolute: 0.3 10*3/uL (ref 0.1–1.0)
Monocytes Relative: 6.9 % (ref 3.0–12.0)
Neutro Abs: 3.4 10*3/uL (ref 1.4–7.7)
Neutrophils Relative %: 68.7 % (ref 43.0–77.0)
Platelets: 167 10*3/uL (ref 150.0–400.0)
RBC: 4.75 Mil/uL (ref 4.22–5.81)
RDW: 13.6 % (ref 11.5–15.5)
WBC: 4.9 10*3/uL (ref 4.0–10.5)

## 2023-05-25 LAB — LDL CHOLESTEROL, DIRECT: Direct LDL: 148 mg/dL

## 2023-05-25 LAB — TSH: TSH: 2.34 u[IU]/mL (ref 0.35–5.50)

## 2023-05-25 LAB — LIPID PANEL
Cholesterol: 211 mg/dL — ABNORMAL HIGH (ref 0–200)
HDL: 54.3 mg/dL (ref >39.00)
LDL Cholesterol: 144 mg/dL — ABNORMAL HIGH (ref 0–99)
NonHDL: 156.96
Total CHOL/HDL Ratio: 4
Triglycerides: 63 mg/dL (ref 0.0–149.0)
VLDL: 12.6 mg/dL (ref 0.0–40.0)

## 2023-05-25 LAB — BRAIN NATRIURETIC PEPTIDE: Pro B Natriuretic peptide (BNP): 180 pg/mL — ABNORMAL HIGH (ref 0.0–100.0)

## 2023-05-25 MED ORDER — GABAPENTIN 100 MG PO CAPS: 100.0000 mg | ORAL_CAPSULE | Freq: Three times a day (TID) | ORAL | 3 refills | Status: DC

## 2023-05-25 NOTE — Assessment & Plan Note (Signed)
Recent ECHO noted Global hypokinesis ,  EF 45-50%  During cardiac workup.  Abnormal stress  With fixed apical defect noted on myoview . He remains asymptomatic with exercise and ADLs and remains opposed to medication regimen

## 2023-05-25 NOTE — Progress Notes (Signed)
Patient ID: Richard Clayton, male    DOB: 07-Sep-1946  Age: 77 y.o. MRN: [id number]  The patient is here for annual preventive examination and management of other chronic and acute problems.   The risk factors are reflected in the social history.  The roster of all physicians providing medical care to patient - is listed in the Snapshot section of the chart.  Activities of daily living:  The patient is 100% independent in all ADLs: dressing, toileting, feeding as well as independent mobility  Home safety : The patient has smoke detectors in the home. They wear seatbelts.  There are no firearms at home. There is no violence in the home.   There is no risks for hepatitis, STDs or HIV. There is no   history of blood transfusion. They have no travel history to infectious disease endemic areas of the world.  The patient has seen their dentist in the last six month. They have seen their eye doctor in the last year. They admit to slight hearing difficulty with regard to whispered voices and some television programs.  They have deferred audiologic testing in the last year.  They do not  have excessive sun exposure. Discussed the need for sun protection: hats, long sleeves and use of sunscreen if there is significant sun exposure.   Diet: the importance of a healthy diet is discussed. They do have a healthy diet.  The benefits of regular aerobic exercise were discussed. She walks 4 times per week ,  20 minutes.   Depression screen: there are no signs or vegative symptoms of depression- irritability, change in appetite, anhedonia, sadness/tearfullness.  Cognitive assessment: the patient manages all their financial and personal affairs and is actively engaged. They could relate day,date,year and events; recalled 2/3 objects at 3 minutes; performed clock-face test normally.  The following portions of the patient's history were reviewed and updated as appropriate: allergies, current medications, past family  history, past medical history,  past surgical history, past social history  and problem list.  Visual acuity was not assessed per patient preference since she has regular follow up with her ophthalmologist. Hearing and body mass index were assessed and reviewed.   During the course of the visit the patient was educated and counseled about appropriate screening and preventive services including : fall prevention , diabetes screening, nutrition counseling, colorectal cancer screening, and recommended immunizations.    CC: The primary encounter diagnosis was Hyperlipidemia with target LDL less than 160. Diagnoses of Fatigue, unspecified type, Impaired fasting glucose, HFrEF (heart failure with reduced ejection fraction) (HCC), Non-ischemic cardiomyopathy (HCC), and Encounter for preventive health examination were also pertinent to this visit.   1) cardiomyopathy:  with decreased systolic function.  Reviewed echocardiogram  performed in January which noted interval decline in systolic function with LVEF of 30-35%. He declined pharmacotherapy per review of note by Dr End. Repeat ECHO In May was unchanged. [name] He disagrees with Dr. Serita Kyle interpretation and feels there has been an improvement  in  EF   from 37 to 44%  since he has made lifestyle changes  (reduced his excessive water and sodium intake ) and doubts the accuracy of the results.  He is exercising regularly , taking Vitamin D 3 supplements 6000 Ius daily based on a meta analysis he read that showed improvement in EF with vitamin supplementation)  He is following a mediterranean/vegetarian diet  and has lost 6 lbs sine cutting back on sodium and water.   Peripheral neuropathy affecting right  hand.  Aggravated by excessive alcohol use.  Used to drink a bottle or more per night when he was younger.     History Sirron has a past medical history of Carpal tunnel syndrome, Cirrhosis (HCC), Hemorrhoids, Hyperlipidemia, and Thoracic aortic aneurysm  (HCC).   He has a past surgical history that includes Tonsilectomy/adenoidectomy with myringotomy (1950); Eye surgery; Cataract extraction w/PHACO (Left, 04/15/2015); Colonoscopy (2011); Tonsillectomy; Colonoscopy with propofol (N/A, 09/04/2015); and Colonoscopy with propofol (N/A, 10/02/2020).   His family history includes Cancer (age of onset: 35) in his father; Cancer (age of onset: 76) in his sister; Coronary artery disease (age of onset: 74) in his brother; Hyperlipidemia in his mother.He reports that he quit smoking about 54 years ago. His smoking use included cigarettes. He smoked an average of 1 pack per day. He has never used smokeless tobacco. He reports current alcohol use. He reports that he does not use drugs.  Outpatient Medications Prior to Visit  Medication Sig Dispense Refill   Ascorbic Acid (VITAMIN C) 1000 MG tablet Take 1,000 mg by mouth daily.     cholecalciferol (VITAMIN D3) 25 MCG (1000 UNIT) tablet Take 4,000 Units by mouth daily.     Cyanocobalamin 2500 MCG SUBL Place 2,500 mcg under the tongue daily.     vitamin E 180 MG (400 UNITS) capsule Take 400 Units by mouth daily.     No facility-administered medications prior to visit.    Review of Systems  Patient denies headache, fevers, malaise, unintentional weight loss, skin rash, eye pain, sinus congestion and sinus pain, sore throat, dysphagia,  hemoptysis , cough, dyspnea, wheezing, chest pain, palpitations, orthopnea, edema, abdominal pain, nausea, melena, diarrhea, constipation, flank pain, dysuria, hematuria, urinary  Frequency, nocturia, numbness, tingling, seizures,  Focal weakness, Loss of consciousness,  Tremor, insomnia, depression, anxiety, and suicidal ideation.    Objective:  BP 122/80   Pulse 64   Temp 97.7 F (36.5 C) (Oral)   Ht 5\' 11"  (1.803 m)   Wt 155 lb 6.4 oz (70.5 kg)   SpO2 97%   BMI 21.67 kg/m   Physical Exam Vitals reviewed.  Constitutional:      General: He is not in acute distress.     Appearance: Normal appearance. He is normal weight. He is not ill-appearing, toxic-appearing or diaphoretic.  HENT:     Head: Normocephalic and atraumatic.     Right Ear: Tympanic membrane, ear canal and external ear normal. There is no impacted cerumen.     Left Ear: Tympanic membrane, ear canal and external ear normal. There is no impacted cerumen.     Nose: Nose normal.     Mouth/Throat:     Mouth: Mucous membranes are moist.     Pharynx: Oropharynx is clear.  Eyes:     General: No scleral icterus.       Right eye: No discharge.        Left eye: No discharge.     Conjunctiva/sclera: Conjunctivae normal.  Neck:     Thyroid: No thyromegaly.     Vascular: No carotid bruit or JVD.  Cardiovascular:     Rate and Rhythm: Normal rate and regular rhythm.     Heart sounds: Normal heart sounds.  Pulmonary:     Effort: Pulmonary effort is normal. No respiratory distress.     Breath sounds: Normal breath sounds.  Abdominal:     General: Bowel sounds are normal.     Palpations: Abdomen is soft. There is no mass.  Tenderness: There is no abdominal tenderness. There is no guarding or rebound.  Musculoskeletal:        General: Normal range of motion.     Cervical back: Normal range of motion and neck supple.  Lymphadenopathy:     Cervical: No cervical adenopathy.  Skin:    General: Skin is warm and dry.  Neurological:     General: No focal deficit present.     Mental Status: He is alert and oriented to person, place, and time. Mental status is at baseline.  Psychiatric:        Mood and Affect: Mood normal.        Behavior: Behavior normal.        Thought Content: Thought content normal.        Judgment: Judgment normal.    Assessment & Plan:  Hyperlipidemia with target LDL less than 160 Assessment & Plan: He has stopped taking Crestor due to statin intolerance and his noninvasive testing with coronary CT showed a very low calcium score of 5.9 (22nc percentile).  He has reduced the  animal protein/fat in his diet by 2/3 with no significant change in LDL or HDL.   Lab Results  Component Value Date   CHOL 211 (H) 05/25/2023   HDL 54.30 05/25/2023   LDLCALC 144 (H) 05/25/2023   LDLDIRECT 148.0 05/25/2023   TRIG 63.0 05/25/2023   CHOLHDL 4 05/25/2023     Orders: -     Lipid panel -     LDL cholesterol, direct  Fatigue, unspecified type -     CBC with Differential/Platelet -     TSH  Impaired fasting glucose -     Comprehensive metabolic panel -     Hemoglobin A1c  HFrEF (heart failure with reduced ejection fraction) (HCC) -     Brain natriuretic peptide  Non-ischemic cardiomyopathy (HCC) Assessment & Plan: Recent ECHO noted Global hypokinesis ,  EF 45-50%  During cardiac workup.  Abnormal stress  With fixed apical defect noted on myoview . He remains asymptomatic with exercise and ADLs and remains opposed to medication regimen   Encounter for preventive health examination Assessment & Plan: age appropriate education and counseling updated, referrals for preventative services and immunizations addressed, dietary and smoking counseling addressed, most recent labs reviewed.  I have personally reviewed and have noted:   1) the patient's medical and social history 2) The pt's use of alcohol, tobacco, and illicit drugs 3) The patient's current medications and supplements 4) Functional ability including ADL's, fall risk, home safety risk, hearing and visual impairment 5) Diet and physical activities 6) Evidence for depression or mood disorder 7) The patient's height, weight, and BMI have been recorded in the chart   I have made referrals, and provided counseling and education based on review of the above   Other orders -     Gabapentin; Take 1 capsule (100 mg total) by mouth 3 (three) times daily.  Dispense: 90 capsule; Refill: 3      I provided 40 minutes of  face-to-face time during this encounter reviewing patient's current problems and past  surgeries,  recent labs and imaging studies, providing counseling on the above mentioned problems , and coordination  of care .   Follow-up: Return in about 1 year (around 05/24/2024).   [name] Shams, MD

## 2023-05-25 NOTE — Assessment & Plan Note (Signed)

## 2023-05-25 NOTE — Assessment & Plan Note (Signed)
He has stopped taking Crestor due to statin intolerance and his noninvasive testing with coronary CT showed a very low calcium score of 5.9 (22nc percentile).  He has reduced the animal protein/fat in his diet by 2/3 with no significant change in LDL or HDL.   Lab Results  Component Value Date   CHOL 211 (H) 05/25/2023   HDL 54.30 05/25/2023   LDLCALC 144 (H) 05/25/2023   LDLDIRECT 148.0 05/25/2023   TRIG 63.0 05/25/2023   CHOLHDL 4 05/25/2023

## 2023-05-25 NOTE — Patient Instructions (Signed)
You can use the gabapentin at night for neuropathic pain.

## 2023-05-27 ENCOUNTER — Encounter: Payer: Self-pay | Admitting: Internal Medicine

## 2023-05-27 DIAGNOSIS — R748 Abnormal levels of other serum enzymes: Secondary | ICD-10-CM

## 2023-06-02 NOTE — Telephone Encounter (Signed)
noted 

## 2023-10-01 ENCOUNTER — Encounter: Payer: Self-pay | Admitting: Internal Medicine

## 2023-10-01 DIAGNOSIS — E538 Deficiency of other specified B group vitamins: Secondary | ICD-10-CM

## 2023-10-01 DIAGNOSIS — E559 Vitamin D deficiency, unspecified: Secondary | ICD-10-CM

## 2023-10-01 DIAGNOSIS — E7800 Pure hypercholesterolemia, unspecified: Secondary | ICD-10-CM

## 2023-10-01 NOTE — Telephone Encounter (Signed)
I have pended orders for your approval.

## 2023-10-04 NOTE — Telephone Encounter (Signed)
noted 

## 2023-10-06 ENCOUNTER — Other Ambulatory Visit (INDEPENDENT_AMBULATORY_CARE_PROVIDER_SITE_OTHER): Payer: Medicare PPO

## 2023-10-06 DIAGNOSIS — E559 Vitamin D deficiency, unspecified: Secondary | ICD-10-CM | POA: Diagnosis not present

## 2023-10-06 DIAGNOSIS — E538 Deficiency of other specified B group vitamins: Secondary | ICD-10-CM

## 2023-10-06 DIAGNOSIS — E7800 Pure hypercholesterolemia, unspecified: Secondary | ICD-10-CM

## 2023-10-06 DIAGNOSIS — Z961 Presence of intraocular lens: Secondary | ICD-10-CM | POA: Diagnosis not present

## 2023-10-06 DIAGNOSIS — H26493 Other secondary cataract, bilateral: Secondary | ICD-10-CM | POA: Diagnosis not present

## 2023-10-06 LAB — COMPREHENSIVE METABOLIC PANEL
ALT: 18 U/L (ref 0–53)
AST: 20 U/L (ref 0–37)
Albumin: 4.2 g/dL (ref 3.5–5.2)
Alkaline Phosphatase: 172 U/L — ABNORMAL HIGH (ref 39–117)
BUN: 23 mg/dL (ref 6–23)
CO2: 31 meq/L (ref 19–32)
Calcium: 9.6 mg/dL (ref 8.4–10.5)
Chloride: 101 meq/L (ref 96–112)
Creatinine, Ser: 0.86 mg/dL (ref 0.40–1.50)
GFR: 83.04 mL/min (ref >60.00)
Glucose, Bld: 89 mg/dL (ref 70–99)
Potassium: 4.6 meq/L (ref 3.5–5.1)
Sodium: 137 meq/L (ref 135–145)
Total Bilirubin: 1 mg/dL (ref 0.2–1.2)
Total Protein: 6.9 g/dL (ref 6.0–8.3)

## 2023-10-06 LAB — LIPID PANEL
Cholesterol: 220 mg/dL — ABNORMAL HIGH (ref 0–200)
HDL: 57.4 mg/dL (ref >39.00)
LDL Cholesterol: 150 mg/dL — ABNORMAL HIGH (ref 0–99)
NonHDL: 162.5
Total CHOL/HDL Ratio: 4
Triglycerides: 63 mg/dL (ref 0.0–149.0)
VLDL: 12.6 mg/dL (ref 0.0–40.0)

## 2023-10-06 LAB — VITAMIN D 25 HYDROXY (VIT D DEFICIENCY, FRACTURES): VITD: 52.18 ng/mL (ref 30.00–100.00)

## 2023-10-06 LAB — VITAMIN B12: Vitamin B-12: 1012 pg/mL — ABNORMAL HIGH (ref 211–911)

## 2023-11-19 DIAGNOSIS — L821 Other seborrheic keratosis: Secondary | ICD-10-CM | POA: Diagnosis not present

## 2023-11-19 DIAGNOSIS — L814 Other melanin hyperpigmentation: Secondary | ICD-10-CM | POA: Diagnosis not present

## 2024-01-27 ENCOUNTER — Encounter: Payer: Self-pay | Admitting: Internal Medicine

## 2024-01-27 DIAGNOSIS — K7469 Other cirrhosis of liver: Secondary | ICD-10-CM

## 2024-01-27 DIAGNOSIS — E559 Vitamin D deficiency, unspecified: Secondary | ICD-10-CM

## 2024-01-27 DIAGNOSIS — E538 Deficiency of other specified B group vitamins: Secondary | ICD-10-CM

## 2024-01-27 DIAGNOSIS — R7301 Impaired fasting glucose: Secondary | ICD-10-CM

## 2024-01-27 DIAGNOSIS — E7800 Pure hypercholesterolemia, unspecified: Secondary | ICD-10-CM

## 2024-01-28 NOTE — Telephone Encounter (Signed)
 Pt called and scheduled his lab visit on 03/07 at 7:30 am. Please advise pt if labs can be ordered before then.

## 2024-01-31 ENCOUNTER — Ambulatory Visit
Admission: RE | Admit: 2024-01-31 | Discharge: 2024-01-31 | Disposition: A | Discharge status: Home / Self Care | Payer: Medicare PPO | Source: Ambulatory Visit | Attending: Internal Medicine | Admitting: Internal Medicine

## 2024-01-31 DIAGNOSIS — K7469 Other cirrhosis of liver: Secondary | ICD-10-CM | POA: Diagnosis not present

## 2024-01-31 DIAGNOSIS — K7460 Unspecified cirrhosis of liver: Secondary | ICD-10-CM | POA: Diagnosis not present

## 2024-02-02 ENCOUNTER — Encounter: Payer: Self-pay | Admitting: Internal Medicine

## 2024-02-04 ENCOUNTER — Other Ambulatory Visit: Payer: Medicare PPO

## 2024-02-04 DIAGNOSIS — E7800 Pure hypercholesterolemia, unspecified: Secondary | ICD-10-CM | POA: Diagnosis not present

## 2024-02-04 DIAGNOSIS — K7469 Other cirrhosis of liver: Secondary | ICD-10-CM

## 2024-02-04 DIAGNOSIS — E559 Vitamin D deficiency, unspecified: Secondary | ICD-10-CM

## 2024-02-04 DIAGNOSIS — E538 Deficiency of other specified B group vitamins: Secondary | ICD-10-CM | POA: Diagnosis not present

## 2024-02-04 DIAGNOSIS — R7301 Impaired fasting glucose: Secondary | ICD-10-CM

## 2024-02-04 LAB — COMPREHENSIVE METABOLIC PANEL
ALT: 21 U/L (ref 0–53)
AST: 22 U/L (ref 0–37)
Albumin: 4.1 g/dL (ref 3.5–5.2)
Alkaline Phosphatase: 162 U/L — ABNORMAL HIGH (ref 39–117)
BUN: 18 mg/dL (ref 6–23)
CO2: 30 meq/L (ref 19–32)
Calcium: 9.5 mg/dL (ref 8.4–10.5)
Chloride: 105 meq/L (ref 96–112)
Creatinine, Ser: 0.83 mg/dL (ref 0.40–1.50)
GFR: 83.74 mL/min (ref >60.00)
Glucose, Bld: 92 mg/dL (ref 70–99)
Potassium: 4.1 meq/L (ref 3.5–5.1)
Sodium: 142 meq/L (ref 135–145)
Total Bilirubin: 0.7 mg/dL (ref 0.2–1.2)
Total Protein: 7.3 g/dL (ref 6.0–8.3)

## 2024-02-04 LAB — LIPID PANEL
Cholesterol: 221 mg/dL — ABNORMAL HIGH (ref 0–200)
HDL: 55.9 mg/dL (ref >39.00)
LDL Cholesterol: 153 mg/dL — ABNORMAL HIGH (ref 0–99)
NonHDL: 164.61
Total CHOL/HDL Ratio: 4
Triglycerides: 57 mg/dL (ref 0.0–149.0)
VLDL: 11.4 mg/dL (ref 0.0–40.0)

## 2024-02-04 LAB — LDL CHOLESTEROL, DIRECT: Direct LDL: 154 mg/dL

## 2024-02-04 LAB — B12 AND FOLATE PANEL
Folate: 21.4 ng/mL (ref >5.9)
Vitamin B-12: 794 pg/mL (ref 211–911)

## 2024-02-04 LAB — HEMOGLOBIN A1C: Hgb A1c MFr Bld: 5.7 % (ref 4.6–6.5)

## 2024-02-04 LAB — VITAMIN D 25 HYDROXY (VIT D DEFICIENCY, FRACTURES): VITD: 72.5 ng/mL (ref 30.00–100.00)

## 2024-02-06 ENCOUNTER — Encounter: Payer: Self-pay | Admitting: Internal Medicine

## 2024-02-07 LAB — AFP TUMOR MARKER: AFP-Tumor Marker: 7.2 ng/mL — ABNORMAL HIGH (ref <6.1)

## 2024-02-15 ENCOUNTER — Encounter: Payer: Self-pay | Admitting: Internal Medicine

## 2024-02-15 NOTE — Telephone Encounter (Signed)
 Have not received the form yet. I will place in your quick sign folder once we receive.

## 2024-02-25 DIAGNOSIS — M25532 Pain in left wrist: Secondary | ICD-10-CM | POA: Diagnosis not present

## 2024-02-25 DIAGNOSIS — M25531 Pain in right wrist: Secondary | ICD-10-CM | POA: Diagnosis not present

## 2024-02-25 DIAGNOSIS — G5603 Carpal tunnel syndrome, bilateral upper limbs: Secondary | ICD-10-CM | POA: Diagnosis not present

## 2024-02-25 DIAGNOSIS — M6281 Muscle weakness (generalized): Secondary | ICD-10-CM | POA: Diagnosis not present

## 2024-02-25 DIAGNOSIS — R279 Unspecified lack of coordination: Secondary | ICD-10-CM | POA: Diagnosis not present

## 2024-03-03 DIAGNOSIS — M25531 Pain in right wrist: Secondary | ICD-10-CM | POA: Diagnosis not present

## 2024-03-03 DIAGNOSIS — M6281 Muscle weakness (generalized): Secondary | ICD-10-CM | POA: Diagnosis not present

## 2024-03-03 DIAGNOSIS — G5603 Carpal tunnel syndrome, bilateral upper limbs: Secondary | ICD-10-CM | POA: Diagnosis not present

## 2024-03-03 DIAGNOSIS — R279 Unspecified lack of coordination: Secondary | ICD-10-CM | POA: Diagnosis not present

## 2024-03-03 DIAGNOSIS — M25532 Pain in left wrist: Secondary | ICD-10-CM | POA: Diagnosis not present

## 2024-03-06 DIAGNOSIS — M25532 Pain in left wrist: Secondary | ICD-10-CM | POA: Diagnosis not present

## 2024-03-06 DIAGNOSIS — M6281 Muscle weakness (generalized): Secondary | ICD-10-CM | POA: Diagnosis not present

## 2024-03-06 DIAGNOSIS — R279 Unspecified lack of coordination: Secondary | ICD-10-CM | POA: Diagnosis not present

## 2024-03-06 DIAGNOSIS — G5603 Carpal tunnel syndrome, bilateral upper limbs: Secondary | ICD-10-CM | POA: Diagnosis not present

## 2024-03-06 DIAGNOSIS — M25531 Pain in right wrist: Secondary | ICD-10-CM | POA: Diagnosis not present

## 2024-03-10 DIAGNOSIS — G5603 Carpal tunnel syndrome, bilateral upper limbs: Secondary | ICD-10-CM | POA: Diagnosis not present

## 2024-03-10 DIAGNOSIS — R279 Unspecified lack of coordination: Secondary | ICD-10-CM | POA: Diagnosis not present

## 2024-03-10 DIAGNOSIS — M6281 Muscle weakness (generalized): Secondary | ICD-10-CM | POA: Diagnosis not present

## 2024-03-10 DIAGNOSIS — M25531 Pain in right wrist: Secondary | ICD-10-CM | POA: Diagnosis not present

## 2024-03-10 DIAGNOSIS — M25532 Pain in left wrist: Secondary | ICD-10-CM | POA: Diagnosis not present

## 2024-03-13 DIAGNOSIS — M25531 Pain in right wrist: Secondary | ICD-10-CM | POA: Diagnosis not present

## 2024-03-13 DIAGNOSIS — G5603 Carpal tunnel syndrome, bilateral upper limbs: Secondary | ICD-10-CM | POA: Diagnosis not present

## 2024-03-13 DIAGNOSIS — R279 Unspecified lack of coordination: Secondary | ICD-10-CM | POA: Diagnosis not present

## 2024-03-13 DIAGNOSIS — M6281 Muscle weakness (generalized): Secondary | ICD-10-CM | POA: Diagnosis not present

## 2024-03-13 DIAGNOSIS — M25532 Pain in left wrist: Secondary | ICD-10-CM | POA: Diagnosis not present

## 2024-03-17 DIAGNOSIS — M25531 Pain in right wrist: Secondary | ICD-10-CM | POA: Diagnosis not present

## 2024-03-17 DIAGNOSIS — R279 Unspecified lack of coordination: Secondary | ICD-10-CM | POA: Diagnosis not present

## 2024-03-17 DIAGNOSIS — M25532 Pain in left wrist: Secondary | ICD-10-CM | POA: Diagnosis not present

## 2024-03-17 DIAGNOSIS — M6281 Muscle weakness (generalized): Secondary | ICD-10-CM | POA: Diagnosis not present

## 2024-03-17 DIAGNOSIS — G5603 Carpal tunnel syndrome, bilateral upper limbs: Secondary | ICD-10-CM | POA: Diagnosis not present

## 2024-03-20 DIAGNOSIS — R279 Unspecified lack of coordination: Secondary | ICD-10-CM | POA: Diagnosis not present

## 2024-03-20 DIAGNOSIS — M6281 Muscle weakness (generalized): Secondary | ICD-10-CM | POA: Diagnosis not present

## 2024-03-20 DIAGNOSIS — M25532 Pain in left wrist: Secondary | ICD-10-CM | POA: Diagnosis not present

## 2024-03-20 DIAGNOSIS — M25531 Pain in right wrist: Secondary | ICD-10-CM | POA: Diagnosis not present

## 2024-03-20 DIAGNOSIS — G5603 Carpal tunnel syndrome, bilateral upper limbs: Secondary | ICD-10-CM | POA: Diagnosis not present

## 2024-03-24 DIAGNOSIS — R279 Unspecified lack of coordination: Secondary | ICD-10-CM | POA: Diagnosis not present

## 2024-03-24 DIAGNOSIS — M6281 Muscle weakness (generalized): Secondary | ICD-10-CM | POA: Diagnosis not present

## 2024-03-24 DIAGNOSIS — M25532 Pain in left wrist: Secondary | ICD-10-CM | POA: Diagnosis not present

## 2024-03-24 DIAGNOSIS — M25531 Pain in right wrist: Secondary | ICD-10-CM | POA: Diagnosis not present

## 2024-03-24 DIAGNOSIS — G5603 Carpal tunnel syndrome, bilateral upper limbs: Secondary | ICD-10-CM | POA: Diagnosis not present

## 2024-03-27 DIAGNOSIS — M6281 Muscle weakness (generalized): Secondary | ICD-10-CM | POA: Diagnosis not present

## 2024-03-27 DIAGNOSIS — M25532 Pain in left wrist: Secondary | ICD-10-CM | POA: Diagnosis not present

## 2024-03-27 DIAGNOSIS — M25531 Pain in right wrist: Secondary | ICD-10-CM | POA: Diagnosis not present

## 2024-03-27 DIAGNOSIS — R279 Unspecified lack of coordination: Secondary | ICD-10-CM | POA: Diagnosis not present

## 2024-03-27 DIAGNOSIS — G5603 Carpal tunnel syndrome, bilateral upper limbs: Secondary | ICD-10-CM | POA: Diagnosis not present

## 2024-03-31 DIAGNOSIS — M25532 Pain in left wrist: Secondary | ICD-10-CM | POA: Diagnosis not present

## 2024-03-31 DIAGNOSIS — M25531 Pain in right wrist: Secondary | ICD-10-CM | POA: Diagnosis not present

## 2024-03-31 DIAGNOSIS — R279 Unspecified lack of coordination: Secondary | ICD-10-CM | POA: Diagnosis not present

## 2024-03-31 DIAGNOSIS — M6281 Muscle weakness (generalized): Secondary | ICD-10-CM | POA: Diagnosis not present

## 2024-03-31 DIAGNOSIS — G5603 Carpal tunnel syndrome, bilateral upper limbs: Secondary | ICD-10-CM | POA: Diagnosis not present

## 2024-04-03 DIAGNOSIS — M6281 Muscle weakness (generalized): Secondary | ICD-10-CM | POA: Diagnosis not present

## 2024-04-03 DIAGNOSIS — G5603 Carpal tunnel syndrome, bilateral upper limbs: Secondary | ICD-10-CM | POA: Diagnosis not present

## 2024-04-03 DIAGNOSIS — M25531 Pain in right wrist: Secondary | ICD-10-CM | POA: Diagnosis not present

## 2024-04-03 DIAGNOSIS — M25532 Pain in left wrist: Secondary | ICD-10-CM | POA: Diagnosis not present

## 2024-04-03 DIAGNOSIS — R279 Unspecified lack of coordination: Secondary | ICD-10-CM | POA: Diagnosis not present

## 2024-04-07 DIAGNOSIS — M25531 Pain in right wrist: Secondary | ICD-10-CM | POA: Diagnosis not present

## 2024-04-07 DIAGNOSIS — R279 Unspecified lack of coordination: Secondary | ICD-10-CM | POA: Diagnosis not present

## 2024-04-07 DIAGNOSIS — M25532 Pain in left wrist: Secondary | ICD-10-CM | POA: Diagnosis not present

## 2024-04-07 DIAGNOSIS — G5603 Carpal tunnel syndrome, bilateral upper limbs: Secondary | ICD-10-CM | POA: Diagnosis not present

## 2024-04-07 DIAGNOSIS — M6281 Muscle weakness (generalized): Secondary | ICD-10-CM | POA: Diagnosis not present

## 2024-05-04 DIAGNOSIS — D1801 Hemangioma of skin and subcutaneous tissue: Secondary | ICD-10-CM | POA: Diagnosis not present

## 2024-05-04 DIAGNOSIS — L570 Actinic keratosis: Secondary | ICD-10-CM | POA: Diagnosis not present

## 2024-05-04 DIAGNOSIS — L814 Other melanin hyperpigmentation: Secondary | ICD-10-CM | POA: Diagnosis not present

## 2024-05-04 DIAGNOSIS — L821 Other seborrheic keratosis: Secondary | ICD-10-CM | POA: Diagnosis not present

## 2024-05-18 DIAGNOSIS — H903 Sensorineural hearing loss, bilateral: Secondary | ICD-10-CM | POA: Diagnosis not present

## 2024-06-15 ENCOUNTER — Encounter: Payer: Self-pay | Admitting: Internal Medicine

## 2024-06-15 ENCOUNTER — Ambulatory Visit (INDEPENDENT_AMBULATORY_CARE_PROVIDER_SITE_OTHER): Admitting: Internal Medicine

## 2024-06-15 VITALS — BP 120/70 | HR 72 | Ht 71.0 in | Wt 161.4 lb

## 2024-06-15 DIAGNOSIS — E559 Vitamin D deficiency, unspecified: Secondary | ICD-10-CM

## 2024-06-15 DIAGNOSIS — Z8619 Personal history of other infectious and parasitic diseases: Secondary | ICD-10-CM

## 2024-06-15 DIAGNOSIS — E785 Hyperlipidemia, unspecified: Secondary | ICD-10-CM

## 2024-06-15 DIAGNOSIS — Z0000 Encounter for general adult medical examination without abnormal findings: Secondary | ICD-10-CM | POA: Diagnosis not present

## 2024-06-15 DIAGNOSIS — I5020 Unspecified systolic (congestive) heart failure: Secondary | ICD-10-CM

## 2024-06-15 DIAGNOSIS — R634 Abnormal weight loss: Secondary | ICD-10-CM | POA: Diagnosis not present

## 2024-06-15 DIAGNOSIS — K7460 Unspecified cirrhosis of liver: Secondary | ICD-10-CM

## 2024-06-15 DIAGNOSIS — R7989 Other specified abnormal findings of blood chemistry: Secondary | ICD-10-CM

## 2024-06-15 LAB — COMPREHENSIVE METABOLIC PANEL WITH GFR
ALT: 19 U/L (ref 0–53)
AST: 22 U/L (ref 0–37)
Albumin: 4.2 g/dL (ref 3.5–5.2)
Alkaline Phosphatase: 161 U/L — ABNORMAL HIGH (ref 39–117)
BUN: 15 mg/dL (ref 6–23)
CO2: 30 meq/L (ref 19–32)
Calcium: 9.5 mg/dL (ref 8.4–10.5)
Chloride: 102 meq/L (ref 96–112)
Creatinine, Ser: 0.75 mg/dL (ref 0.40–1.50)
GFR: 86.13 mL/min (ref >60.00)
Glucose, Bld: 89 mg/dL (ref 70–99)
Potassium: 4.3 meq/L (ref 3.5–5.1)
Sodium: 138 meq/L (ref 135–145)
Total Bilirubin: 0.8 mg/dL (ref 0.2–1.2)
Total Protein: 6.9 g/dL (ref 6.0–8.3)

## 2024-06-15 LAB — CBC WITH DIFFERENTIAL/PLATELET
Basophils Absolute: 0 K/uL (ref 0.0–0.1)
Basophils Relative: 0.8 % (ref 0.0–3.0)
Eosinophils Absolute: 0.1 K/uL (ref 0.0–0.7)
Eosinophils Relative: 2.5 % (ref 0.0–5.0)
HCT: 41.5 % (ref 39.0–52.0)
Hemoglobin: 14 g/dL (ref 13.0–17.0)
Lymphocytes Relative: 18.4 % (ref 12.0–46.0)
Lymphs Abs: 0.9 K/uL (ref 0.7–4.0)
MCHC: 33.7 g/dL (ref 30.0–36.0)
MCV: 88.1 fl (ref 78.0–100.0)
Monocytes Absolute: 0.4 K/uL (ref 0.1–1.0)
Monocytes Relative: 8.9 % (ref 3.0–12.0)
Neutro Abs: 3.4 K/uL (ref 1.4–7.7)
Neutrophils Relative %: 69.4 % (ref 43.0–77.0)
Platelets: 185 K/uL (ref 150.0–400.0)
RBC: 4.71 Mil/uL (ref 4.22–5.81)
RDW: 13.3 % (ref 11.5–15.5)
WBC: 4.9 K/uL (ref 4.0–10.5)

## 2024-06-15 LAB — PROTIME-INR
INR: 1 ratio (ref 0.8–1.0)
Prothrombin Time: 11 s (ref 9.6–13.1)

## 2024-06-15 LAB — APTT: aPTT: 28.5 s (ref 25.4–36.8)

## 2024-06-15 LAB — VITAMIN D 25 HYDROXY (VIT D DEFICIENCY, FRACTURES): VITD: 63.27 ng/mL (ref 30.00–100.00)

## 2024-06-15 NOTE — Progress Notes (Signed)
 Patient ID: Richard Clayton, male    DOB: 1946/03/25  Age: 78 y.o. MRN: [id number]  The patient is here for annual preventive examination and management of other chronic and acute problems.   The risk factors are reflected in the social history.   The roster of all physicians providing medical care to patient - is listed in the Snapshot section of the chart.   Activities of daily living:  The patient is 100% independent in all ADLs: dressing, toileting, feeding as well as independent mobility   Home safety : The patient has smoke detectors in the home. They wear seatbelts.  There are no unsecured firearms at home. There is no violence in the home.    There is no risks for hepatitis, STDs or HIV. There is no   history of blood transfusion. They have no travel history to infectious disease endemic areas of the world.   The patient has seen their dentist in the last six month. They have seen their eye doctor in the last year. The patinet  denies slight hearing difficulty with regard to whispered voices and some television programs.  They have deferred audiologic testing in the last year.  They do not  have excessive sun exposure. Discussed the need for sun protection: hats, long sleeves and use of sunscreen if there is significant sun exposure.    Diet: the importance of a healthy diet is discussed. They do have a healthy diet.   The benefits of regular aerobic exercise were discussed. The patient  exercises  3 to 5 days per week  for  60 minutes.    Depression screen: there are no signs or vegative symptoms of depression- irritability, change in appetite, anhedonia, sadness/tearfullness.   The following portions of the patient's history were reviewed and updated as appropriate: allergies, current medications, past family history, past medical history,  past surgical history, past social history  and problem list.   Visual acuity was not assessed per patient preference since the patient has  regular follow up with an  ophthalmologist. Hearing and body mass index were assessed and reviewed.    During the course of the visit the patient was educated and counseled about appropriate screening and preventive services including : fall prevention , diabetes screening, nutrition counseling, colorectal cancer screening, and recommended immunizations.    Chief Complaint:  HPI     Annual Exam    Additional comments: Physical       Last edited by Harriette Raisin, CMA on 06/15/2024  9:32 AM.     avoiding sun but exercising daiy and following a healthy diet.  Does cardio 6 days per week   at least 300 MINUTES WEEKLY  Taking 6000 ius od D3 and once weekly B12 1000 mcg    Review of Symptoms  Patient denies headache, fevers, malaise, unintentional weight loss, skin rash, eye pain, sinus congestion and sinus pain, sore throat, dysphagia,  hemoptysis , cough, dyspnea, wheezing, chest pain, palpitations, orthopnea, edema, abdominal pain, nausea, melena, diarrhea, constipation, flank pain, dysuria, hematuria, urinary  Frequency, nocturia, numbness, tingling, seizures,  Focal weakness, Loss of consciousness,  Tremor, insomnia, depression, anxiety, and suicidal ideation.    Physical Exam:  BP 120/70   Pulse 72   Ht 5' 11 (1.803 m)   Wt 161 lb 6.4 oz (73.2 kg)   SpO2 97%   BMI 22.51 kg/m    Physical Exam Vitals reviewed.  Constitutional:      General: He is not in acute distress.  Appearance: Normal appearance. He is normal weight. He is not ill-appearing, toxic-appearing or diaphoretic.  HENT:     Head: Normocephalic and atraumatic.     Right Ear: Tympanic membrane, ear canal and external ear normal. There is no impacted cerumen.     Left Ear: Tympanic membrane, ear canal and external ear normal. There is no impacted cerumen.     Nose: Nose normal.     Mouth/Throat:     Mouth: Mucous membranes are moist.     Pharynx: Oropharynx is clear.  Eyes:     General: No scleral  icterus.       Right eye: No discharge.        Left eye: No discharge.     Conjunctiva/sclera: Conjunctivae normal.  Neck:     Thyroid : No thyromegaly.     Vascular: No carotid bruit or JVD.  Cardiovascular:     Rate and Rhythm: Normal rate and regular rhythm.     Heart sounds: Normal heart sounds.  Pulmonary:     Effort: Pulmonary effort is normal. No respiratory distress.     Breath sounds: Normal breath sounds.  Abdominal:     General: Bowel sounds are normal.     Palpations: Abdomen is soft. There is no mass.     Tenderness: There is no abdominal tenderness. There is no guarding or rebound.  Musculoskeletal:        General: Normal range of motion.     Cervical back: Normal range of motion and neck supple.  Lymphadenopathy:     Cervical: No cervical adenopathy.  Skin:    General: Skin is warm and dry.  Neurological:     General: No focal deficit present.     Mental Status: He is alert and oriented to person, place, and time. Mental status is at baseline.  Psychiatric:        Mood and Affect: Mood normal.        Behavior: Behavior normal.        Thought Content: Thought content normal.        Judgment: Judgment normal.     Assessment and Plan: History of hepatitis B virus infection conferring immunity -     Hepatitis B core antibody, IgM; Future -     Hepatitis B surface antibody,qualitative; Future  Cirrhosis of liver without ascites, unspecified hepatic cirrhosis type [facility]) Assessment & Plan: Diagnosis is suggested by prior ultraosund but not ssen on CT .  AFP  TUMOR MARKER WAS SLIGHTLY ELEVATED and coming down  4 months ago . Will repeat in 2 months.    Orders: -     AFP tumor marker; Future -     CBC with Differential/Platelet; Future -     Comprehensive metabolic panel with GFR; Future -     Protime-INR; Future -     APTT; Future  Vitamin D  deficiency -     VITAMIN D  25 Hydroxy (Vit-D Deficiency, Fractures)  Unintentional weight loss of 10% body weight  within 6 months Assessment & Plan: He has regained 10 lbs and now following the Mediterranean diet rather than the KETO   Hyperlipidemia with target LDL less than 160 Assessment & Plan: Managed without statins du to history of statin myalgias.    Lab Results  Component Value Date   CHOL 221 (H) 02/04/2024   HDL 55.90 02/04/2024   LDLCALC 153 (H) 02/04/2024   LDLDIRECT 154.0 02/04/2024   TRIG 57.0 02/04/2024   CHOLHDL 4 02/04/2024  HFrEF (heart failure with reduced ejection fraction) (HCC) Assessment & Plan: He has moderate to severely reduced E F  of 35% by 2024 ECHO. But remains asymptomatic and defers medication.   Encounter for preventive health examination Assessment & Plan: age appropriate education and counseling updated, referrals for preventative services and immunizations addressed, dietary and smoking counseling addressed, most recent labs reviewed.  I have personally reviewed and have noted:   1) the patient's medical and social history 2) The pt's use of alcohol, tobacco, and illicit drugs 3) The patient's current medications and supplements 4) Functional ability including ADL's, fall risk, home safety risk, hearing and visual impairment 5) Diet and physical activities 6) Evidence for depression or mood disorder 7) The patient's height, weight, and BMI have been recorded in the chart   I have made referrals, and provided counseling and education based on review of the above      Return in about 1 year (around 06/15/2025).  Verneita LITTIE Kettering, MD

## 2024-06-15 NOTE — Patient Instructions (Addendum)
 I'd like to check the AFP tumor marker one ore time in September to make sure it is stable or normalizing

## 2024-06-16 LAB — HEPATITIS B CORE ANTIBODY, IGM: Hep B C IgM: NONREACTIVE

## 2024-06-16 LAB — HEPATITIS B SURFACE ANTIBODY,QUALITATIVE: Hep B S Ab: REACTIVE — AB

## 2024-06-17 NOTE — Assessment & Plan Note (Signed)

## 2024-06-17 NOTE — Assessment & Plan Note (Addendum)
 Diagnosis is suggested by prior ultraosund but not ssen on CT .  AFP  TUMOR MARKER WAS SLIGHTLY ELEVATED and coming down  4 months ago . Will repeat in 2 months.

## 2024-06-17 NOTE — Assessment & Plan Note (Signed)
 He has regained 10 lbs and now following the Mediterranean diet rather than the KETO

## 2024-06-17 NOTE — Assessment & Plan Note (Signed)
 He has moderate to severely reduced E F  of 35% by 2024 ECHO. But remains asymptomatic and defers medication.

## 2024-06-17 NOTE — Assessment & Plan Note (Signed)
 Managed without statins du to history of statin myalgias.    Lab Results  Component Value Date   CHOL 221 (H) 02/04/2024   HDL 55.90 02/04/2024   LDLCALC 153 (H) 02/04/2024   LDLDIRECT 154.0 02/04/2024   TRIG 57.0 02/04/2024   CHOLHDL 4 02/04/2024

## 2024-06-18 ENCOUNTER — Other Ambulatory Visit: Payer: Self-pay | Admitting: Internal Medicine

## 2024-06-18 ENCOUNTER — Ambulatory Visit: Payer: Self-pay | Admitting: Internal Medicine

## 2024-06-18 DIAGNOSIS — K7460 Unspecified cirrhosis of liver: Secondary | ICD-10-CM

## 2024-08-16 ENCOUNTER — Telehealth: Payer: Self-pay | Admitting: Internal Medicine

## 2024-08-16 DIAGNOSIS — E7800 Pure hypercholesterolemia, unspecified: Secondary | ICD-10-CM

## 2024-08-16 NOTE — Telephone Encounter (Signed)
 Note states fasting labs order needed

## 2024-08-18 ENCOUNTER — Encounter: Payer: Self-pay | Admitting: Internal Medicine

## 2024-08-18 NOTE — Telephone Encounter (Signed)
 Pt requesting additional labs at upcoming lab appt.  However based on labs draw in July & CBC was normal & CMP was normal except for the Alkaline Phosphatase (161)

## 2024-08-19 ENCOUNTER — Other Ambulatory Visit: Payer: Self-pay | Admitting: Internal Medicine

## 2024-08-19 DIAGNOSIS — K7469 Other cirrhosis of liver: Secondary | ICD-10-CM

## 2024-08-21 ENCOUNTER — Ambulatory Visit
Admission: RE | Admit: 2024-08-21 | Discharge: 2024-08-21 | Disposition: A | Discharge status: Home / Self Care | Source: Ambulatory Visit | Attending: Internal Medicine | Admitting: Internal Medicine

## 2024-08-21 DIAGNOSIS — K7460 Unspecified cirrhosis of liver: Secondary | ICD-10-CM | POA: Diagnosis not present

## 2024-08-24 ENCOUNTER — Ambulatory Visit: Payer: Self-pay | Admitting: Internal Medicine

## 2024-08-28 ENCOUNTER — Other Ambulatory Visit (INDEPENDENT_AMBULATORY_CARE_PROVIDER_SITE_OTHER)

## 2024-08-28 DIAGNOSIS — E7800 Pure hypercholesterolemia, unspecified: Secondary | ICD-10-CM | POA: Diagnosis not present

## 2024-08-28 DIAGNOSIS — K7469 Other cirrhosis of liver: Secondary | ICD-10-CM | POA: Diagnosis not present

## 2024-08-28 LAB — COMPREHENSIVE METABOLIC PANEL WITH GFR
ALT: 18 U/L (ref 0–53)
AST: 21 U/L (ref 0–37)
Albumin: 4.1 g/dL (ref 3.5–5.2)
Alkaline Phosphatase: 148 U/L — ABNORMAL HIGH (ref 39–117)
BUN: 15 mg/dL (ref 6–23)
CO2: 30 meq/L (ref 19–32)
Calcium: 9.3 mg/dL (ref 8.4–10.5)
Chloride: 102 meq/L (ref 96–112)
Creatinine, Ser: 0.81 mg/dL (ref 0.40–1.50)
GFR: 84.03 mL/min (ref >60.00)
Glucose, Bld: 88 mg/dL (ref 70–99)
Potassium: 4.1 meq/L (ref 3.5–5.1)
Sodium: 138 meq/L (ref 135–145)
Total Bilirubin: 0.6 mg/dL (ref 0.2–1.2)
Total Protein: 6.6 g/dL (ref 6.0–8.3)

## 2024-08-28 LAB — LIPID PANEL
Cholesterol: 232 mg/dL — ABNORMAL HIGH (ref 0–200)
HDL: 54.9 mg/dL (ref >39.00)
LDL Cholesterol: 164 mg/dL — ABNORMAL HIGH (ref 0–99)
NonHDL: 177.52
Total CHOL/HDL Ratio: 4
Triglycerides: 66 mg/dL (ref 0.0–149.0)
VLDL: 13.2 mg/dL (ref 0.0–40.0)

## 2024-08-28 LAB — CBC WITH DIFFERENTIAL/PLATELET
Basophils Absolute: 0.1 K/uL (ref 0.0–0.1)
Basophils Relative: 1.2 % (ref 0.0–3.0)
Eosinophils Absolute: 0.2 K/uL (ref 0.0–0.7)
Eosinophils Relative: 3.4 % (ref 0.0–5.0)
HCT: 41.4 % (ref 39.0–52.0)
Hemoglobin: 13.9 g/dL (ref 13.0–17.0)
Lymphocytes Relative: 20.6 % (ref 12.0–46.0)
Lymphs Abs: 1 K/uL (ref 0.7–4.0)
MCHC: 33.6 g/dL (ref 30.0–36.0)
MCV: 87.7 fl (ref 78.0–100.0)
Monocytes Absolute: 0.4 K/uL (ref 0.1–1.0)
Monocytes Relative: 9.4 % (ref 3.0–12.0)
Neutro Abs: 3.1 K/uL (ref 1.4–7.7)
Neutrophils Relative %: 65.4 % (ref 43.0–77.0)
Platelets: 182 K/uL (ref 150.0–400.0)
RBC: 4.72 Mil/uL (ref 4.22–5.81)
RDW: 13.4 % (ref 11.5–15.5)
WBC: 4.8 K/uL (ref 4.0–10.5)

## 2024-08-30 LAB — AFP TUMOR MARKER: AFP-Tumor Marker: 6.1 ng/mL — ABNORMAL HIGH (ref <6.1)

## 2024-08-31 ENCOUNTER — Ambulatory Visit: Payer: Self-pay | Admitting: Internal Medicine

## 2024-08-31 DIAGNOSIS — E785 Hyperlipidemia, unspecified: Secondary | ICD-10-CM

## 2024-09-01 MED ORDER — EZETIMIBE 10 MG PO TABS: 10.0000 mg | ORAL_TABLET | Freq: Every day | ORAL | 1 refills | Status: AC

## 2024-10-25 DIAGNOSIS — Z961 Presence of intraocular lens: Secondary | ICD-10-CM | POA: Diagnosis not present

## 2024-12-06 ENCOUNTER — Other Ambulatory Visit

## 2024-12-06 DIAGNOSIS — E785 Hyperlipidemia, unspecified: Secondary | ICD-10-CM | POA: Diagnosis not present

## 2024-12-06 LAB — COMPREHENSIVE METABOLIC PANEL WITH GFR
ALT: 22 U/L (ref 3–53)
AST: 21 U/L (ref 5–37)
Albumin: 4.1 g/dL (ref 3.5–5.2)
Alkaline Phosphatase: 128 U/L — ABNORMAL HIGH (ref 39–117)
BUN: 14 mg/dL (ref 6–23)
CO2: 30 meq/L (ref 19–32)
Calcium: 9 mg/dL (ref 8.4–10.5)
Chloride: 102 meq/L (ref 96–112)
Creatinine, Ser: 0.95 mg/dL (ref 0.40–1.50)
GFR: 76.14 mL/min
Glucose, Bld: 88 mg/dL (ref 70–99)
Potassium: 3.9 meq/L (ref 3.5–5.1)
Sodium: 137 meq/L (ref 135–145)
Total Bilirubin: 1.2 mg/dL (ref 0.2–1.2)
Total Protein: 6.6 g/dL (ref 6.0–8.3)

## 2024-12-06 LAB — LIPID PANEL
Cholesterol: 174 mg/dL (ref 28–200)
HDL: 49.7 mg/dL
LDL Cholesterol: 110 mg/dL — ABNORMAL HIGH (ref 10–99)
NonHDL: 123.86
Total CHOL/HDL Ratio: 3
Triglycerides: 67 mg/dL (ref 10.0–149.0)
VLDL: 13.4 mg/dL (ref 0.0–40.0)

## 2024-12-06 LAB — LDL CHOLESTEROL, DIRECT: Direct LDL: 115 mg/dL

## 2024-12-09 ENCOUNTER — Ambulatory Visit: Payer: Self-pay | Admitting: Internal Medicine

## 2025-01-05 ENCOUNTER — Other Ambulatory Visit: Payer: Self-pay

## 2025-01-05 ENCOUNTER — Emergency Department

## 2025-01-05 ENCOUNTER — Emergency Department
Admission: EM | Admit: 2025-01-05 | Discharge: 2025-01-05 | Disposition: A | Discharge status: Home / Self Care | Source: Home / Self Care | Attending: Emergency Medicine | Admitting: Emergency Medicine

## 2025-01-05 ENCOUNTER — Encounter: Payer: Self-pay | Admitting: Emergency Medicine

## 2025-01-05 DIAGNOSIS — M7989 Other specified soft tissue disorders: Secondary | ICD-10-CM

## 2025-01-05 DIAGNOSIS — R7989 Other specified abnormal findings of blood chemistry: Secondary | ICD-10-CM

## 2025-01-05 DIAGNOSIS — I509 Heart failure, unspecified: Secondary | ICD-10-CM

## 2025-01-05 DIAGNOSIS — R0600 Dyspnea, unspecified: Secondary | ICD-10-CM

## 2025-01-05 DIAGNOSIS — I4891 Unspecified atrial fibrillation: Secondary | ICD-10-CM

## 2025-01-05 LAB — CBC
HCT: 42.9 % (ref 39.0–52.0)
Hemoglobin: 13.9 g/dL (ref 13.0–17.0)
MCH: 28.4 pg (ref 26.0–34.0)
MCHC: 32.4 g/dL (ref 30.0–36.0)
MCV: 87.7 fL (ref 80.0–100.0)
Platelets: 214 10*3/uL (ref 150–400)
RBC: 4.89 MIL/uL (ref 4.22–5.81)
RDW: 13.5 % (ref 11.5–15.5)
WBC: 7 10*3/uL (ref 4.0–10.5)
nRBC: 0 % (ref 0.0–0.2)

## 2025-01-05 LAB — COMPREHENSIVE METABOLIC PANEL WITH GFR
ALT: 47 U/L — ABNORMAL HIGH (ref 0–44)
AST: 53 U/L — ABNORMAL HIGH (ref 15–41)
Albumin: 3.8 g/dL (ref 3.5–5.0)
Alkaline Phosphatase: 138 U/L — ABNORMAL HIGH (ref 38–126)
Anion gap: 13 (ref 5–15)
BUN: 18 mg/dL (ref 8–23)
CO2: 21 mmol/L — ABNORMAL LOW (ref 22–32)
Calcium: 9.1 mg/dL (ref 8.9–10.3)
Chloride: 103 mmol/L (ref 98–111)
Creatinine, Ser: 1.04 mg/dL (ref 0.61–1.24)
GFR, Estimated: >60 mL/min
Glucose, Bld: 105 mg/dL — ABNORMAL HIGH (ref 70–99)
Potassium: 4 mmol/L (ref 3.5–5.1)
Sodium: 137 mmol/L (ref 135–145)
Total Bilirubin: 1 mg/dL (ref 0.0–1.2)
Total Protein: 6.6 g/dL (ref 6.5–8.1)

## 2025-01-05 LAB — RESP PANEL BY RT-PCR (RSV, FLU A&B, COVID)  RVPGX2
Influenza A by PCR: NEGATIVE
Influenza B by PCR: NEGATIVE
Resp Syncytial Virus by PCR: NEGATIVE
SARS Coronavirus 2 by RT PCR: NEGATIVE

## 2025-01-05 LAB — TROPONIN T, HIGH SENSITIVITY
Troponin T High Sensitivity: 33 ng/L — ABNORMAL HIGH (ref 0–19)
Troponin T High Sensitivity: 31 ng/L — ABNORMAL HIGH (ref 0–19)

## 2025-01-05 LAB — PRO BRAIN NATRIURETIC PEPTIDE: Pro Brain Natriuretic Peptide: 6959 pg/mL — ABNORMAL HIGH

## 2025-01-05 MED ORDER — IOHEXOL 350 MG/ML SOLN
75.0000 mL | Freq: Once | INTRAVENOUS | Status: AC | PRN
  Administered 2025-01-05: 75 mL via INTRAVENOUS

## 2025-01-05 MED ORDER — APIXABAN 5 MG PO TABS: 5.0000 mg | ORAL_TABLET | Freq: Two times a day (BID) | ORAL | 0 refills | Status: AC

## 2025-01-05 MED ORDER — FUROSEMIDE 10 MG/ML IJ SOLN
40.0000 mg | Freq: Once | INTRAMUSCULAR | Status: AC
  Administered 2025-01-05: 40 mg via INTRAVENOUS
  Filled 2025-01-05: qty 4

## 2025-01-05 MED ORDER — FUROSEMIDE 40 MG PO TABS: 40.0000 mg | ORAL_TABLET | Freq: Every day | ORAL | 11 refills | Status: AC

## 2025-01-05 NOTE — ED Triage Notes (Signed)
 Patient ambulatory to triage with steady gait, without difficulty or distress noted; pt reports x 2 days having SHOB and weakness, accomp by upper chest pain, nonradiating

## 2025-01-05 NOTE — ED Notes (Signed)
 Fall risk bracelet on, grip socks on, and bed alarm on.

## 2025-01-05 NOTE — ED Provider Notes (Signed)
 [name] Provider Note    Event Date/Time   First MD Initiated Contact with Patient 01/05/25 0719     (approximate)   History   Shortness of Breath   HPI  Richard Clayton is a 79 y.o. male with history of cirrhosis, hyperlipidemia, presenting with shortness of breath.  States that he has also noticed a 4 pound weight gain as well as lower extremity swelling.  Also notes orthopnea for last several days.  States that he has been having a cough for a month.  Does note anterior chest pressure that comes on with the cough.  States the chest pain is anterior, nonradiating, not sharp or tearing.  No numbness tingling or weakness.  No fever, no urinary symptoms, no nausea vomiting or diarrhea, no abdominal pain.  He denies any recent travel or surgeries, no history of blood clots, no unilateral calf swelling tenderness, denies malignancies.  States he thinks that symptoms might be related to eating more chicken soup lately.  Not on a diuretic.    On independent chart review, he was seen by primary care in July of last year, he was urged for follow-up, does have history of cirrhosis without ascites, not on any blood thinning medications.  Patient was also seen by Dr. Mady in 2023, was initially evaluated by him for possible A-fib, EKGs were reviewed and felt most consistent with sinus rhythm with PVCs.  Did have an echo in 2024 that shows an EF of 30 to 35%.   Physical Exam   Triage Vital Signs: ED Triage Vitals  Encounter Vitals Group     BP 01/05/25 0658 (!) 163/96     Girls Systolic BP Percentile --      Girls Diastolic BP Percentile --      Boys Systolic BP Percentile --      Boys Diastolic BP Percentile --      Pulse Rate 01/05/25 0658 93     Resp 01/05/25 0658 16     Temp 01/05/25 0658 (!) 97.4 F (36.3 C)     Temp Source 01/05/25 0658 Oral     SpO2 01/05/25 0658 97 %     Weight 01/05/25 0653 174 lb (78.9 kg)     Height 01/05/25 0653 5' 11 (1.803 m)      Head Circumference --      Peak Flow --      Pain Score 01/05/25 0653 5     Pain Loc --      Pain Education --      Exclude from Growth Chart --     Most recent vital signs: Vitals:   01/05/25 0658  BP: (!) 163/96  Pulse: 93  Resp: 16  Temp: (!) 97.4 F (36.3 C)  SpO2: 97%     General: Awake, no distress.  CV:  Good peripheral perfusion.  Resp:  Normal effort.  No tachypnea or respiratory distress Abd:  No distention.  Soft nontender Other:  Bilateral lower extremity edema that appears symmetrical, equal radial and DP pulses bilaterally, no focal weakness or numbness.   ED Results / Procedures / Treatments   Labs (all labs ordered are listed, but only abnormal results are displayed) Labs Reviewed  COMPREHENSIVE METABOLIC PANEL WITH GFR - Abnormal; Notable for the following components:      Result Value   CO2 21 (*)    Glucose, Bld 105 (*)    AST 53 (*)    ALT 47 (*)  Alkaline Phosphatase 138 (*)    All other components within normal limits  PRO BRAIN NATRIURETIC PEPTIDE - Abnormal; Notable for the following components:   Pro Brain Natriuretic Peptide 6,959.0 (*)    All other components within normal limits  TROPONIN T, HIGH SENSITIVITY - Abnormal; Notable for the following components:   Troponin T High Sensitivity 33 (*)    All other components within normal limits  TROPONIN T, HIGH SENSITIVITY - Abnormal; Notable for the following components:   Troponin T High Sensitivity 31 (*)    All other components within normal limits  RESP PANEL BY RT-PCR (RSV, FLU A&B, COVID)  [id number]  CBC     EKG  EKG shows, atrial fibrillation with PVCs, normal QS, normal QTc, no obvious ischemic ST elevation, this is changed compared to prior since prior EKG shows sinus rhythm.   RADIOLOGY On my independent interpretation, CT without obvious PE   PROCEDURES:  Critical Care performed: No  Procedures   MEDICATIONS ORDERED IN ED: Medications  furosemide  (LASIX )  injection 40 mg (40 mg Intravenous Given 01/05/25 0824)  iohexol  (OMNIPAQUE ) 350 MG/ML injection 75 mL (75 mLs Intravenous Contrast Given 01/05/25 0853)     IMPRESSION / MDM / ASSESSMENT AND PLAN / ED COURSE  I reviewed the triage vital signs and the nursing notes.                              Differential diagnosis includes, but is not limited to, CHF exacerbation, volume overload, electrolyte derangements, arrhythmia, ACS, did consider pneumonia, viral illness.  Did also consider PE but he is not hypoxic, no other risk factors for it, appears volume overloaded at this time seems more consistent with CHF.  Labs, EKG, troponin, chest x-ray.  Patient's presentation is most consistent with acute presentation with potential threat to life or bodily function.  Independent interpretation of labs and imaging below.  Clinical course as below.  Discussed admission but patient is not wanting to stay, states that he does not want to start any new medications or have any further workup until he can see his primary care doctor.  Understands the risk of leaving including worsening symptoms, decompensation and death.  Will still send Lasix  as well as Eliquis  to his pharmacy.  Did discuss with him about outpatient follow-up with his primary care doctor to get repeat labs done due to his mildly elevated LFTs.  Did discuss with him extensively about outpatient cardiology follow-up this week or early next week.  Will discharge with strict return precautions.    Clinical Course as of 01/05/25 1031  Fri Jan 05, 2025  9258 DG Chest 2 View IMPRESSION: Atelectasis or scarring at the left lung base with probable underlying component of basilar chronic interstitial disease.   [TT]  0815 Pending review of labs, troponins mildly elevated, electrolytes not severely deranged, LFTs are elevated but T. bili is normal, he has no abdominal pain at this time.  Suspect this could be related to his cirrhosis.  No leukocytosis.   His BNP is elevated, he is getting Lasix . [TT]  [id number] Chest x-ray does show some atelectasis or scarring to the left lung base, no overt pulmonary edema noted, will order CT PE study. [TT]  0930 Resp panel by RT-PCR (RSV, Flu A&B, Covid) Anterior Nasal Swab neg [TT]  0945 CT Angio Chest PE W/Cm &/Or Wo Cm IMPRESSION: 1. No pulmonary embolism. 2. Cardiomegaly with findings of minimal  interstitial edema and small right and trace left pleural effusions. 3. Unchanged fusiform aneurysm of the ascending aorta, measuring 4.6 cm.   [TT]  1002 Had a long discussion with patient about imaging and lab results including findings of volume overload.  Did discuss with cardiology Dr. And who recommended admission for optimization given that patient has been lost to follow-up in the past and has not wanted additional interventions for his CHF.  Patient states that he does not agree with admission, does not want to stay, understands the risk of leaving including decompensation and death.  Did discuss with him that we can start him on Eliquis  as well as Lasix  here, patient states that we can send the prescriptions to his pharmacy but he will not be starting them until he sees his primary care doctor.  Did ask if he wants another referral to remove the cardiologist since he does not want to see doctor and outpatient, patient declined and states that he wants to see his primary care doctor.  Does understand that this is not our medical advice, does understand the risks of not following or medical advice.  Patient is able to make medical decisions, is alert, no impairment.  Did discuss with him about waiting for the second troponin and he is agreeable with this plan. [TT]  1007 Patient's chadsvasc is greater than 2. [TT]  1028 Troponin T High Sensitivity(!): 31 Troponin x 2 is stable. [TT]    Clinical Course User Index [TT] Waymond Lorelle Cummins, MD     FINAL CLINICAL IMPRESSION(S) / ED DIAGNOSES   Final diagnoses:   Dyspnea, unspecified type  Acute on chronic congestive heart failure, unspecified heart failure type [facility])  Atrial fibrillation, unspecified type (HCC)  Leg swelling  Elevated LFTs     Rx / DC Orders   ED Discharge Orders          Ordered    furosemide  (LASIX ) 40 MG tablet  Daily        01/05/25 1009    apixaban  (ELIQUIS ) 5 MG TABS tablet  2 times daily        01/05/25 1009             Note:  This document was prepared using Dragon voice recognition software and may include unintentional dictation errors.    Waymond Lorelle Cummins, MD 01/05/25 [phone]

## 2025-01-05 NOTE — Discharge Instructions (Addendum)
 Please take the medications as prescribed for your new atrial fibrillation as well as volume overload.  Please follow-up with cardiology for reassessment and further management this week or early next week.  Your liver function enzymes were mildly elevated today.  Please make sure to follow-up with your primary care doctor and get repeat labs done next week.

## 2025-06-18 ENCOUNTER — Encounter: Admitting: Internal Medicine
